# Patient Record
Sex: Male | Born: 1960 | Race: White | Hispanic: No | Marital: Married | Smoking: Former smoker
Health system: Southern US, Community
[De-identification: ages and names within clinical notes are randomized; demographics above are authoritative.]

## PROBLEM LIST (undated history)

## (undated) DIAGNOSIS — F329 Major depressive disorder, single episode, unspecified: Secondary | ICD-10-CM

## (undated) DIAGNOSIS — I499 Cardiac arrhythmia, unspecified: Secondary | ICD-10-CM

## (undated) DIAGNOSIS — K635 Polyp of colon: Secondary | ICD-10-CM

## (undated) DIAGNOSIS — G4700 Insomnia, unspecified: Secondary | ICD-10-CM

## (undated) DIAGNOSIS — F32A Depression, unspecified: Secondary | ICD-10-CM

## (undated) DIAGNOSIS — R001 Bradycardia, unspecified: Secondary | ICD-10-CM

## (undated) DIAGNOSIS — R413 Other amnesia: Secondary | ICD-10-CM

## (undated) HISTORY — DX: Other amnesia: R41.3

## (undated) HISTORY — PX: HERNIA REPAIR: SHX51

## (undated) HISTORY — DX: Polyp of colon: K63.5

## (undated) HISTORY — DX: Insomnia, unspecified: G47.00

## (undated) HISTORY — DX: Major depressive disorder, single episode, unspecified: F32.9

## (undated) HISTORY — DX: Bradycardia, unspecified: R00.1

## (undated) HISTORY — PX: OTHER SURGICAL HISTORY: SHX169

## (undated) HISTORY — DX: Depression, unspecified: F32.A

---

## 2003-10-26 ENCOUNTER — Ambulatory Visit (HOSPITAL_BASED_OUTPATIENT_CLINIC_OR_DEPARTMENT_OTHER): Admission: RE | Admit: 2003-10-26 | Discharge: 2003-10-26 | Payer: Self-pay | Admitting: Orthopedic Surgery

## 2003-10-26 ENCOUNTER — Ambulatory Visit (HOSPITAL_COMMUNITY): Admission: RE | Admit: 2003-10-26 | Discharge: 2003-10-26 | Payer: Self-pay | Admitting: Orthopedic Surgery

## 2004-02-29 ENCOUNTER — Ambulatory Visit (HOSPITAL_BASED_OUTPATIENT_CLINIC_OR_DEPARTMENT_OTHER): Admission: RE | Admit: 2004-02-29 | Discharge: 2004-02-29 | Payer: Self-pay | Admitting: Orthopedic Surgery

## 2005-04-13 ENCOUNTER — Other Ambulatory Visit: Admission: RE | Admit: 2005-04-13 | Discharge: 2005-04-13 | Payer: Self-pay | Admitting: Urology

## 2007-07-30 ENCOUNTER — Ambulatory Visit (HOSPITAL_COMMUNITY): Admission: RE | Admit: 2007-07-30 | Discharge: 2007-07-30 | Payer: Self-pay | Admitting: Family Medicine

## 2010-07-29 NOTE — Op Note (Signed)
NAMEZAYON, TRULSON               ACCOUNT NO.:  [id number]   MEDICAL RECORD NO.:  [id number]          PATIENT TYPE:  AMB   LOCATION:  DSC                          FACILITY:  [facility]   PHYSICIAN:  Feliberto Gottron. Turner Daniels, M.D.   DATE OF BIRTH:  Sep 05, 1960   DATE OF PROCEDURE:  02/29/2004  DATE OF DISCHARGE:                                 OPERATIVE REPORT   PREOPERATIVE DIAGNOSES:  Right knee medial meniscal tear.   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   OPERATION PERFORMED:  Right knee arthroscopic partial lateral meniscectomy  for a bucket handle complex tear and debridement of chondromalacia.   SURGEON:  Feliberto Gottron. Turner Daniels, M.D.   ASSISTANTLaural Benes. Jannet Mantis.   ANESTHESIA:  Local with intravenous sedation.   ESTIMATED BLOOD LOSS:  Minimal.   FLUIDS REPLACED:  900 mL crystalloids.   DRAINS:  None.   TOURNIQUET TIME:  None.   INDICATIONS FOR PROCEDURE:  The patient is a 50 year old man with ACL  reconstruction some 10 years ago on both sides, recently had an arthroscopic  debridement on the left.  He has a symptomatic medial meniscal tear on the  right and possible chondromalacia and he is taken for arthroscopic  evaluation and treatment of the same.   DESCRIPTION OF PROCEDURE:  The patient was identified by arm band and taken  to the operating room at [facility].  Appropriate  anesthetic monitors were attached.  Local anesthesia with intravenous  sedation was induced into the right lower extremity.  Lateral post applied  to the table.  Right lower extremity prepped and draped in the usual sterile  fashion from the ankle to the midthigh.  Using a #11 blade, standard  inferomedial and inferolateral parapatellar portals were made allowing  introduction of the arthroscope through the inferolateral portal and the  outflow  through the inferomedial portal.  Diagnostic arthroscopy revealed  grade 3 chondromalacia of the trochlea, medial femoral condyle and also the  distal aspect of the lateral femoral condyle requiring extensive debridement  with a 3.5 Gator sucker shaver and even slightly with a 4.2 [place]  sucker shaver.  Moving to the medial compartment we identified a medial  meniscal tear, bucket handle complex with a horizontal cleave tear as well.  This was debrided back to a stable margin removing most of the posterior  horn of the medial meniscus with a combination of the straight biters, the  4.2 [place] sucker shaver and a 3.5 Gator sucker shaver.  We also  debrided chondromalacia from the distal aspect of the medial femoral condyle  grade 2 to 3.  Moving into the notch, the ACL graft appeared to be somewhat  atrophic but was intact on the lateral side.  There was some lateral femoral  condyle chondromalacia that was debrided.  The lateral meniscus was intact  as was the  lateral tibial plateau.  The gutters were cleared.  The knee was irrigated  out with  normal saline solution.  The arthroscopic instruments were removed  and a dressing of Xeroform, 4 x 4 dressing sponges, Webril and Ace wrap was  applied.  The patient was unclamped, awakened and taken to the recovery room  without difficulty.      Emilio Aspen  D:  02/29/2004  T:  03/01/2004  Job:  [id number]

## 2010-07-29 NOTE — Op Note (Signed)
NAMEARNEZ, STONEKING                           ACCOUNT NO.:  [id number]   MEDICAL RECORD NO.:  [id number]                   PATIENT TYPE:  AMB   LOCATION:  DSC                                  FACILITY:  [facility]   PHYSICIAN:  Feliberto Gottron. Turner Daniels, M.D.                DATE OF BIRTH:  1960/10/29   DATE OF PROCEDURE:  10/26/2003  DATE OF DISCHARGE:                                 OPERATIVE REPORT   PREOPERATIVE DIAGNOSIS:  Left knee medial meniscal tear with Baker's cyst  and possible chondromalacia.   POSTOPERATIVE DIAGNOSES:  1. Left knee medial meniscal tear, transverse cleavage, complex, posterior     horn.  2. 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  3. Cartilaginous loose body, 1/2 x 1/14 inch x 3/16 inch.   PROCEDURE:  Left knee arthroscopic debridement and removal of loose body.   SURGEON:  Feliberto Gottron. Turner Daniels, M.D.   FIRST ASSISTANT:  Erskine Squibb B. Jannet Mantis.   ANESTHESIA:  Local with IV sedation.   ESTIMATED BLOOD LOSS:  Minimal.   FLUIDS REPLACED:  800 mL of crystalloid.   DRAINS PLACED:  None.   TOURNIQUET TIME:  None.   INDICATION FOR PROCEDURE:  The patient is a 50 year old man who has had a  previous endoscopic ACL reconstruction to his left knee and a couple of  arthroscopic debridements, now with chronic effusions, catchings and popping  in his left knee, also MRI-proven Baker's cyst and, by my reading, probable  medial meniscal tear.  He desires elective arthroscopic evaluation and  treatment of his left knee, having failed conservative measures and having  persistent pain, catching, and popping.  Risks and benefits of surgery are  understood by the patient.   DESCRIPTION OF PROCEDURE:  The patient identified by arm band and taken to  the operating room at [facility], appropriate anesthetic  monitors were attached, and local anesthetic with IV sedation induced into  the left knee.  A lateral post  applied to the table, the left lower  extremity prepped and draped in the usual sterile fashion from the ankle to  the mid-thigh, and we began the procedure by making standard inferomedial  and inferolateral peripatellar portals with a #11 blade, allowing  introduction of the arthroscope through the inferior lateral portal and the  outflow through the inferomedial portal.  Diagnostic arthroscopy revealed  minimal chondromalacia of the patella.  The trochlea had grade 3 to grade 4  chondromalacia over numerous areas and was debrided back to stable margins.  A large intra-articular cartilaginous loose body was identified,  photographed, and removed in the medial compartment, 1 x 2 x 0.5 cm in size.  The donor site was probably the trochlea or chondromalacia of the lateral  femoral condyle, which was also debrided.  Moving into the medial  compartment, we identified  a complex horizontal cleavage tear with a T  component in the posterior horn of the medial meniscus.  This was removed  piecemeal with the arthroscopic biters and a 3.5 gator sucker shaver, which  was also used to smooth out any articular cartilage tears during the  procedure.  At one point we were able to probe the tip, the 3.5 tip with the  motor off, into probably the lumen of the neck of the Baker's cyst and got  some fluid consistent with Baker's cyst fluid out through the neck of the  sac.  The ACL graft was probed and found to be intact on the lateral side,  the articular cartilage.  Again there was grade 3 to grade 4 chondromalacia  of the lateral femoral condyle.  The meniscus was in relatively good shape  with minimal fraying.  At this point the knee was irrigated out with normal  saline solution and the arthroscopic instruments removed.  A dressing of  Xeroform, 4 x 4 dressing sponges, Webril, and an Ace wrap applied.  The  patient was awakened and taken to the recovery room without difficulty.                                                Feliberto Gottron. Turner Daniels, M.D.    Ovid Curd  D:  10/26/2003  T:  10/26/2003  Job:  [id number]

## 2012-05-01 ENCOUNTER — Telehealth: Payer: Self-pay

## 2012-05-01 NOTE — Telephone Encounter (Signed)
Pt was referred by Dr. Phillips Odor for screening colonoscopy. Called, left message for pt to return call.

## 2012-05-07 NOTE — Telephone Encounter (Signed)
LMOM to call.

## 2012-05-16 NOTE — Telephone Encounter (Signed)
LMOM to call.

## 2012-05-20 NOTE — Telephone Encounter (Signed)
Letter to pt and PCP.  

## 2012-12-30 ENCOUNTER — Other Ambulatory Visit: Payer: Self-pay

## 2012-12-30 ENCOUNTER — Telehealth: Payer: Self-pay

## 2012-12-30 DIAGNOSIS — Z1211 Encounter for screening for malignant neoplasm of colon: Secondary | ICD-10-CM

## 2012-12-30 NOTE — Telephone Encounter (Signed)
Appropriate for procedure.

## 2012-12-30 NOTE — Telephone Encounter (Signed)
Gastroenterology Pre-Procedure Review  Request Date: 12/30/2012 Requesting Physician: Dr. Phillips Odor  PATIENT REVIEW QUESTIONS: The patient responded to the following health history questions as indicated:    1. Diabetes Melitis: no 2. Joint replacements in the past 12 months: no 3. Major health problems in the past 3 months: no 4. Has an artificial valve or MVP: no 5. Has a defibrillator: no 6. Has been advised in past to take antibiotics in advance of a procedure like teeth cleaning: no    MEDICATIONS & ALLERGIES:    Patient reports the following regarding taking any blood thinners:   Plavix? no Aspirin? Yes Coumadin? no  Patient confirms/reports the following medications:  Current Outpatient Prescriptions  Medication Sig Dispense Refill  . aspirin 81 MG tablet Take 81 mg by mouth daily.      [name] glucosamine-chondroitin 500-400 MG tablet Take 1 tablet by mouth 1 day or 1 dose. Once daily      . zolpidem (AMBIEN) 10 MG tablet Take 10 mg by mouth at bedtime as needed for sleep. Pt said he only takes 2-3 nights per week.       No current facility-administered medications for this visit.    Patient confirms/reports the following allergies:  No Known Allergies  No orders of the defined types were placed in this encounter.    AUTHORIZATION INFORMATION Primary Insurance:   ID #:   Group #:  Pre-Cert / Auth required:  Pre-Cert / Auth #:   Secondary Insurance:   ID #:   Group #:  Pre-Cert / Auth required: Pre-Cert / Auth #:   SCHEDULE INFORMATION: Procedure has been scheduled as follows:  Date: 01/17/2013              Time: 8:15 AM  Location: [facility] Short Stay  This Gastroenterology Pre-Precedure Review Form is being routed to the following provider(s): R. Roetta Sessions, MD

## 2012-12-31 MED ORDER — PEG-KCL-NACL-NASULF-NA ASC-C 100 G PO SOLR: 1.0000 | ORAL | Status: DC

## 2012-12-31 NOTE — Telephone Encounter (Signed)
Rx sent to pharmacy and instructions mailed to pt.  

## 2013-01-03 ENCOUNTER — Encounter (HOSPITAL_COMMUNITY): Payer: Self-pay | Admitting: Pharmacy Technician

## 2013-01-09 ENCOUNTER — Telehealth: Payer: Self-pay

## 2013-01-09 NOTE — Telephone Encounter (Signed)
I called [facility] at [phone] and spoke to [employer]. PA is not required for screening colonoscopy.

## 2013-01-15 ENCOUNTER — Telehealth: Payer: Self-pay

## 2013-01-15 NOTE — Telephone Encounter (Signed)
Pt called to go over the prep instructions. I had mistakenly put day of procedure to begin clear liquids. He remembered our conversation and confirmed that he is aware that he has no more solid foods after lunch today and will continue clear liquids on 01/16/2013.

## 2013-01-17 ENCOUNTER — Encounter (HOSPITAL_COMMUNITY)
Admission: RE | Disposition: A | Discharge status: Home / Self Care | Payer: Self-pay | Source: Ambulatory Visit | Attending: Internal Medicine

## 2013-01-17 ENCOUNTER — Ambulatory Visit (HOSPITAL_COMMUNITY)
Admission: RE | Admit: 2013-01-17 | Discharge: 2013-01-17 | Disposition: A | Discharge status: Home / Self Care | Payer: PRIVATE HEALTH INSURANCE | Source: Ambulatory Visit | Attending: Internal Medicine | Admitting: Internal Medicine

## 2013-01-17 ENCOUNTER — Encounter (HOSPITAL_COMMUNITY): Payer: Self-pay | Admitting: *Deleted

## 2013-01-17 DIAGNOSIS — K5730 Diverticulosis of large intestine without perforation or abscess without bleeding: Secondary | ICD-10-CM

## 2013-01-17 DIAGNOSIS — D126 Benign neoplasm of colon, unspecified: Secondary | ICD-10-CM

## 2013-01-17 DIAGNOSIS — Z1211 Encounter for screening for malignant neoplasm of colon: Secondary | ICD-10-CM | POA: Insufficient documentation

## 2013-01-17 HISTORY — PX: COLONOSCOPY: SHX5424

## 2013-01-17 SURGERY — COLONOSCOPY
Anesthesia: Moderate Sedation

## 2013-01-17 MED ORDER — STERILE WATER FOR IRRIGATION IR SOLN
Status: DC | PRN
  Administered 2013-01-17: 08:00:00

## 2013-01-17 MED ORDER — MIDAZOLAM HCL 5 MG/5ML IJ SOLN
INTRAMUSCULAR | Status: DC | PRN
  Administered 2013-01-17: 2 mg via INTRAVENOUS
  Administered 2013-01-17 (×3): 1 mg via INTRAVENOUS

## 2013-01-17 MED ORDER — SODIUM CHLORIDE 0.9 % IV SOLN
INTRAVENOUS | Status: DC
  Administered 2013-01-17: 1000 mL via INTRAVENOUS

## 2013-01-17 MED ORDER — MEPERIDINE HCL 100 MG/ML IJ SOLN
INTRAMUSCULAR | Status: DC | PRN
  Administered 2013-01-17: 50 mg via INTRAVENOUS
  Administered 2013-01-17: 25 mg via INTRAVENOUS

## 2013-01-17 MED ORDER — MIDAZOLAM HCL 5 MG/5ML IJ SOLN
INTRAMUSCULAR | Status: AC
  Filled 2013-01-17: qty 10

## 2013-01-17 MED ORDER — MEPERIDINE HCL 100 MG/ML IJ SOLN
INTRAMUSCULAR | Status: AC
  Filled 2013-01-17: qty 2

## 2013-01-17 MED ORDER — ONDANSETRON HCL 4 MG/2ML IJ SOLN
INTRAMUSCULAR | Status: AC
  Filled 2013-01-17: qty 2

## 2013-01-17 MED ORDER — ONDANSETRON HCL 4 MG/2ML IJ SOLN
INTRAMUSCULAR | Status: DC | PRN
  Administered 2013-01-17: 4 mg via INTRAVENOUS

## 2013-01-17 NOTE — Op Note (Signed)
[facility] [address] [place], [address]   COLONOSCOPY PROCEDURE REPORT  PATIENT: Alex, Weaver  MR#:         [id number] BIRTHDATE: 10/16/1960 , 52  yrs. old GENDER: Male ENDOSCOPIST: R.  Roetta Sessions, MD FACP FACG REFERRED BY:  Assunta Found, M.D. PROCEDURE DATE:  01/17/2013 PROCEDURE:     Colonoscopy with snare polypectomy  INDICATIONS: First-ever average risk colorectal cancer screening examination  INFORMED CONSENT:  The risks, benefits, alternatives and imponderables including but not limited to bleeding, perforation as well as the possibility of a missed lesion have been reviewed.  The potential for biopsy, lesion removal, etc. have also been discussed.  Questions have been answered.  All parties agreeable. Please see the history and physical in the medical record for more information.  MEDICATIONS: Versed 5 mg IV and Demerol 75 mg IV in divided doses. Zofran 4 mg IV  DESCRIPTION OF PROCEDURE:  After a digital rectal exam was performed, the EC-3890Li ([id number])  colonoscope was advanced from the anus through the rectum and colon to the area of the cecum, ileocecal valve and appendiceal orifice.  The cecum was deeply intubated.  These structures were well-seen and photographed for the record.  From the level of the cecum and ileocecal valve, the scope was slowly and cautiously withdrawn.  The mucosal surfaces were carefully surveyed utilizing scope tip deflection to facilitate fold flattening as needed.  The scope was pulled down into the rectum where a thorough examination including retroflexion was performed.    FINDINGS:  Adequate preparation. Normal rectum. Few scattered sigmoid diverticula; (1) 5 mm sessile polyp in the base of the cecum..  Otherwise, the remainder of the colonic mucosa appeared normal.  THERAPEUTIC / DIAGNOSTIC MANEUVERS PERFORMED:  The above-mentioned polyps wascold snare removed  COMPLICATIONS: none  CECAL  WITHDRAWAL TIME:  11 minutes  IMPRESSION:  Colonic diverticulosis. Colonic polyp-removed as described above  RECOMMENDATIONS: Followup on pathology   _______________________________ eSigned:  R. Roetta Sessions, MD FACP [facility] 01/17/2013 9:01 AM   CC:    PATIENT NAME:  Alex, Weaver MR#: [id number]

## 2013-01-17 NOTE — H&P (Signed)
  Primary Care Physician:  Colette Ribas, MD Primary Gastroenterologist:  Dr. Jena Gauss  Pre-Procedure History & Physical: HPI:  Alex Weaver is a 52 y.o. male is here for a screening colonoscopy. No bowel symptoms. No prior colonoscopy. No family history of colon cancer or polyps.  History reviewed. No pertinent past medical history.  Past Surgical History  Procedure Laterality Date  . Bilateral knee surgery Bilateral     bilateral acl repair  . Hernia repair Left     inguinal    Prior to Admission medications   Medication Sig Start Date End Date Taking? Authorizing Provider  aspirin 81 MG tablet Take 81 mg by mouth daily.   Yes Historical Provider, MD  glucosamine-chondroitin 500-400 MG tablet Take 1 tablet by mouth 1 day or 1 dose. Once daily   Yes Historical Provider, MD  peg 3350 powder (MOVIPREP) 100 G SOLR Take 1 kit (200 g total) by mouth as directed. 12/31/12  Yes Corbin Ade, MD  zolpidem (AMBIEN) 10 MG tablet Take 10 mg by mouth at bedtime as needed for sleep. Pt said he only takes 2-3 nights per week.   Yes Historical Provider, MD    Allergies as of 12/30/2012  . (No Known Allergies)    History reviewed. No pertinent family history.  History   Social History  . Marital Status: Married    Spouse Name: N/A    Number of Children: N/A  . Years of Education: N/A   Occupational History  . Not on file.   Social History Main Topics  . Smoking status: Former Smoker    Types: Cigarettes  . Smokeless tobacco: Not on file  . Alcohol Use: Not on file  . Drug Use: Not on file  . Sexual Activity: Not on file   Other Topics Concern  . Not on file   Social History Narrative  . No narrative on file    Review of Systems: See HPI, otherwise negative ROS  Physical Exam: BP 121/72  Pulse 67  Temp(Src) 97.6 F (36.4 C) (Oral)  Resp 15  Ht 5\' 10"  (1.778 m)  Wt 175 lb (79.379 kg)  BMI 25.11 kg/m2  SpO2 99% General:   Alert,  Well-developed,  well-nourished, pleasant and cooperative in NAD Head:  Normocephalic and atraumatic. Eyes:  Sclera clear, no icterus.   Conjunctiva pink. Ears:  Normal auditory acuity. Nose:  No deformity, discharge,  or lesions. Mouth:  No deformity or lesions, dentition normal. Neck:  Supple; no masses or thyromegaly. Lungs:  Clear throughout to auscultation.   No wheezes, crackles, or rhonchi. No acute distress. Heart:  Regular rate and rhythm; no murmurs, clicks, rubs,  or gallops. Abdomen:  Soft, nontender and nondistended. No masses, hepatosplenomegaly or hernias noted. Normal bowel sounds, without guarding, and without rebound.   Msk:  Symmetrical without gross deformities. Normal posture. Pulses:  Normal pulses noted. Extremities:  Without clubbing or edema. Neurologic:  Alert and  oriented x4;  grossly normal neurologically. Impression/Plan: Alex Weaver is now here to undergo a screening colonoscopy.  First-ever average risk screening examination Risks, benefits, limitations, imponderables and alternatives regarding colonoscopy have been reviewed with the patient. Questions have been answered. All parties agreeable.

## 2013-01-20 ENCOUNTER — Encounter: Payer: Self-pay | Admitting: Internal Medicine

## 2013-01-22 ENCOUNTER — Encounter (HOSPITAL_COMMUNITY): Payer: Self-pay | Admitting: Internal Medicine

## 2017-01-17 DIAGNOSIS — F329 Major depressive disorder, single episode, unspecified: Secondary | ICD-10-CM | POA: Diagnosis not present

## 2017-01-17 DIAGNOSIS — Z23 Encounter for immunization: Secondary | ICD-10-CM | POA: Diagnosis not present

## 2017-01-17 DIAGNOSIS — Z1389 Encounter for screening for other disorder: Secondary | ICD-10-CM | POA: Diagnosis not present

## 2017-01-17 DIAGNOSIS — Z6827 Body mass index (BMI) 27.0-27.9, adult: Secondary | ICD-10-CM | POA: Diagnosis not present

## 2017-01-17 DIAGNOSIS — G4700 Insomnia, unspecified: Secondary | ICD-10-CM | POA: Diagnosis not present

## 2017-01-17 DIAGNOSIS — E663 Overweight: Secondary | ICD-10-CM | POA: Diagnosis not present

## 2017-07-27 DIAGNOSIS — R001 Bradycardia, unspecified: Secondary | ICD-10-CM | POA: Diagnosis not present

## 2017-07-27 DIAGNOSIS — Z0001 Encounter for general adult medical examination with abnormal findings: Secondary | ICD-10-CM | POA: Diagnosis not present

## 2017-07-27 DIAGNOSIS — Z6828 Body mass index (BMI) 28.0-28.9, adult: Secondary | ICD-10-CM | POA: Diagnosis not present

## 2017-07-27 DIAGNOSIS — F329 Major depressive disorder, single episode, unspecified: Secondary | ICD-10-CM | POA: Diagnosis not present

## 2017-07-27 DIAGNOSIS — R413 Other amnesia: Secondary | ICD-10-CM | POA: Diagnosis not present

## 2017-07-27 DIAGNOSIS — E663 Overweight: Secondary | ICD-10-CM | POA: Diagnosis not present

## 2017-07-27 DIAGNOSIS — G4700 Insomnia, unspecified: Secondary | ICD-10-CM | POA: Diagnosis not present

## 2017-07-27 DIAGNOSIS — Z1389 Encounter for screening for other disorder: Secondary | ICD-10-CM | POA: Diagnosis not present

## 2017-07-27 DIAGNOSIS — K635 Polyp of colon: Secondary | ICD-10-CM | POA: Diagnosis not present

## 2017-07-30 DIAGNOSIS — Z1389 Encounter for screening for other disorder: Secondary | ICD-10-CM | POA: Diagnosis not present

## 2017-08-14 ENCOUNTER — Ambulatory Visit (INDEPENDENT_AMBULATORY_CARE_PROVIDER_SITE_OTHER): Payer: BLUE CROSS/BLUE SHIELD | Admitting: Neurology

## 2017-08-14 ENCOUNTER — Encounter: Payer: Self-pay | Admitting: *Deleted

## 2017-08-14 ENCOUNTER — Encounter: Payer: Self-pay | Admitting: Neurology

## 2017-08-14 VITALS — BP 112/73 | HR 64 | Ht 70.0 in | Wt 195.0 lb

## 2017-08-14 DIAGNOSIS — R413 Other amnesia: Secondary | ICD-10-CM | POA: Insufficient documentation

## 2017-08-14 NOTE — Progress Notes (Signed)
PATIENT: Alex Weaver DOB: 30-Nov-1960  Chief Complaint  Patient presents with  . Memory Loss    MMSE 30/30 - 23 animals.  He is here with his wife, [name], to have his short-term memory loss further evaluated.  He has been having increasing problems over the last six months.  He is concerned due to his mother having dementia.  [name] PCP    Sharilyn Sites, MD     HISTORICAL  Alex Weaver is a 57 years old male, seen in refer by his primary care doctor Sharilyn Sites for evaluation of memory loss, initial evaluation was on June 06/2017.  He is accompanied by his wife [name] at today's clinical visit.  I reviewed and summarized the referring note, he has past medical history of mild dementia he is taking Lexapro 10 mg daily, he readily from college, is a Librarian, academic for quality laboratory.  His mother suffered dementia, she became symptomatic in her late 46s, at age 22, she was at assisted living.  Mr. [name] went through stressful few years, he has a lot of stress at his job, also have to place his mother into facility, his wife also suffered cancer, went through treatment.  He developed mild depression, loss of 30 pounds over short period of time, overall he has mild improvement.  Over the past couple years, he was noted to have mild memory loss, tends to misplace things, hard to keep up with new names, sometimes forgot to lock the door,  He is otherwise functioning well, denied difficulty dealing with his job or daily activity.  Laboratory evaluations showed alcohol level less than 5, normal CMP, creatinine 1.09, hemoglobin of 15.3, normal TSH 3.09, LDL was mildly elevated 128, cholesterol was 208, testosterone 439,  REVIEW OF SYSTEMS: Full 14 system review of systems performed and notable only for increased thirst, joint pain, memory loss, insomnia, not enough sleep  ALLERGIES: No Known Allergies  HOME MEDICATIONS: Current Outpatient Medications  Medication Sig Dispense Refill    . escitalopram (LEXAPRO) 10 MG tablet Take 10 mg by mouth daily.    [name] glucosamine-chondroitin 500-400 MG tablet Take 1 tablet by mouth 1 day or 1 dose. Once daily    . TEMAZEPAM PO Take 1 tablet by mouth at bedtime.     No current facility-administered medications for this visit.     PAST MEDICAL HISTORY: Past Medical History:  Diagnosis Date  . Bradycardia   . Depression   . Insomnia   . Memory loss   . Polyp of colon     PAST SURGICAL HISTORY: Past Surgical History:  Procedure Laterality Date  . bilateral knee surgery Bilateral    bilateral acl repair  . COLONOSCOPY N/A 01/17/2013   Procedure: COLONOSCOPY;  Surgeon: Daneil Dolin, MD;  Location: [facility];  Service: Endoscopy;  Laterality: N/A;  8:15 AM  . HERNIA REPAIR Left    inguinal    FAMILY HISTORY: Family History  Problem Relation Age of Onset  . Dementia Mother   . Lung cancer Father   . Diabetes Father     SOCIAL HISTORY:  Social History   Socioeconomic History  . Marital status: Married    Spouse name: Not on file  . Number of children: 1  . Years of education: 47  . Highest education level: Bachelor's degree (e.g., BA, AB, BS)  Occupational History  . Occupation: Facilities manager  Social Needs  . Financial resource strain: Not on file  .  Food insecurity:    Worry: Not on file    Inability: Not on file  . Transportation needs:    Medical: Not on file    Non-medical: Not on file  Tobacco Use  . Smoking status: Former Smoker    Types: Cigarettes  . Smokeless tobacco: Never Used  Substance and Sexual Activity  . Alcohol use: Yes    Comment: occasional   . Drug use: Never  . Sexual activity: Not on file  Lifestyle  . Physical activity:    Days per week: Not on file    Minutes per session: Not on file  . Stress: Not on file  Relationships  . Social connections:    Talks on phone: Not on file    Gets together: Not on file    Attends religious service: Not on file    Active  member of club or organization: Not on file    Attends meetings of clubs or organizations: Not on file    Relationship status: Not on file  . Intimate partner violence:    Fear of current or ex partner: Not on file    Emotionally abused: Not on file    Physically abused: Not on file    Forced sexual activity: Not on file  Other Topics Concern  . Not on file  Social History Narrative   Caffeine use: 2 cups per day.   Right-handed.   Lives at home with his wife.     PHYSICAL EXAM   Vitals:   08/14/17 0735  BP: 112/73  Pulse: 64  Weight: 195 lb (88.5 kg)  Height: 5\' 10"  (1.778 m)    Not recorded      Body mass index is 27.98 kg/m.  PHYSICAL EXAMNIATION:  Gen: NAD, conversant, well nourised, obese, well groomed                     Cardiovascular: Regular rate rhythm, no peripheral edema, warm, nontender. Eyes: Conjunctivae clear without exudates or hemorrhage Neck: Supple, no carotid bruits. Pulmonary: Clear to auscultation bilaterally   NEUROLOGICAL EXAM:  MENTAL STATUS: MMSE - Mini Mental State Exam 08/14/2017  Orientation to time 5  Orientation to Place 5  Registration 3  Attention/ Calculation 5  Recall 3  Language- name 2 objects 2  Language- repeat 1  Language- follow 3 step command 3  Language- read & follow direction 1  Write a sentence 1  Copy design 1  Total score 30  animal naming 23   CRANIAL NERVES: CN II: Visual fields are full to confrontation. Fundoscopic exam is normal with sharp discs and no vascular changes. Pupils are round equal and briskly reactive to light. CN III, IV, VI: extraocular movement are normal. No ptosis. CN V: Facial sensation is intact to pinprick in all 3 divisions bilaterally. Corneal responses are intact.  CN VII: Face is symmetric with normal eye closure and smile. CN VIII: Hearing is normal to rubbing fingers CN IX, X: Palate elevates symmetrically. Phonation is normal. CN XI: Head turning and shoulder shrug are  intact CN XII: Tongue is midline with normal movements and no atrophy.  MOTOR: There is no pronator drift of out-stretched arms. Muscle bulk and tone are normal. Muscle strength is normal.  REFLEXES: Reflexes are 2+ and symmetric at the biceps, triceps, knees, and ankles. Plantar responses are flexor.  SENSORY: Intact to light touch, pinprick, positional sensation and vibratory sensation are intact in fingers and toes.  COORDINATION: Rapid alternating  movements and fine finger movements are intact. There is no dysmetria on finger-to-nose and heel-knee-shin.    GAIT/STANCE: Posture is normal. Gait is steady with normal steps, base, arm swing, and turning. Heel and toe walking are normal. Tandem gait is normal.  Romberg is absent.   DIAGNOSTIC DATA (LABS, IMAGING, TESTING) - I reviewed patient records, labs, notes, testing and imaging myself where available.   ASSESSMENT AND PLAN  DARREK LEASURE is a 57 y.o. male   Memory loss  In the setting of extreme stress, mild depression,  His mood disorder likely play a major role in these complaints,  Does have family history of dementia  Proceed with MRI of brain without contrast   Check B12 level   Marcial Pacas, M.D. Ph.D.  [facility] [address], [place], [place] [address] Ph: [phone] Fax: [phone]  CC: Sharilyn Sites, MD

## 2017-08-15 ENCOUNTER — Telehealth: Payer: Self-pay | Admitting: *Deleted

## 2017-08-15 ENCOUNTER — Telehealth: Payer: Self-pay | Admitting: Neurology

## 2017-08-15 LAB — B12 AND FOLATE PANEL
Folate: >20 ng/mL (ref >3.0)
Vitamin B-12: 477 pg/mL (ref 232–1245)

## 2017-08-15 NOTE — Telephone Encounter (Signed)
Spoke to patient's wife on [place].  She is aware of results.

## 2017-08-15 NOTE — Telephone Encounter (Signed)
-----   Message from Marcial Pacas, MD sent at 08/15/2017 11:34 AM EDT ----- Please call patient for normal laboratory result

## 2017-08-15 NOTE — Telephone Encounter (Signed)
lvm for pt to call back about scheduling mri  [employer] BTYO:[id number] (exp. 08/14/17 to 09/12/17)

## 2017-08-15 NOTE — Telephone Encounter (Signed)
The patient returned my call. He is scheduled for 08/22/17 at [facility].

## 2017-08-22 ENCOUNTER — Ambulatory Visit: Payer: BLUE CROSS/BLUE SHIELD

## 2017-08-22 DIAGNOSIS — R413 Other amnesia: Secondary | ICD-10-CM

## 2017-08-24 ENCOUNTER — Telehealth: Payer: Self-pay | Admitting: *Deleted

## 2017-08-24 NOTE — Telephone Encounter (Signed)
-----   Message from Marcial Pacas, MD sent at 08/24/2017 10:30 AM EDT ----- Please call pt for normal MRI brain.

## 2017-08-24 NOTE — Telephone Encounter (Signed)
Spoke to patient - he is aware of his MRI results.

## 2017-11-26 DIAGNOSIS — M25511 Pain in right shoulder: Secondary | ICD-10-CM | POA: Diagnosis not present

## 2017-11-26 DIAGNOSIS — M6281 Muscle weakness (generalized): Secondary | ICD-10-CM | POA: Diagnosis not present

## 2017-11-28 DIAGNOSIS — M25511 Pain in right shoulder: Secondary | ICD-10-CM | POA: Diagnosis not present

## 2017-11-28 DIAGNOSIS — M6281 Muscle weakness (generalized): Secondary | ICD-10-CM | POA: Diagnosis not present

## 2017-12-03 DIAGNOSIS — M6281 Muscle weakness (generalized): Secondary | ICD-10-CM | POA: Diagnosis not present

## 2017-12-03 DIAGNOSIS — M25511 Pain in right shoulder: Secondary | ICD-10-CM | POA: Diagnosis not present

## 2017-12-05 DIAGNOSIS — M25511 Pain in right shoulder: Secondary | ICD-10-CM | POA: Diagnosis not present

## 2017-12-05 DIAGNOSIS — M6281 Muscle weakness (generalized): Secondary | ICD-10-CM | POA: Diagnosis not present

## 2017-12-11 DIAGNOSIS — M25511 Pain in right shoulder: Secondary | ICD-10-CM | POA: Diagnosis not present

## 2017-12-11 DIAGNOSIS — M6281 Muscle weakness (generalized): Secondary | ICD-10-CM | POA: Diagnosis not present

## 2017-12-13 DIAGNOSIS — M25511 Pain in right shoulder: Secondary | ICD-10-CM | POA: Diagnosis not present

## 2017-12-13 DIAGNOSIS — M6281 Muscle weakness (generalized): Secondary | ICD-10-CM | POA: Diagnosis not present

## 2017-12-18 DIAGNOSIS — M6281 Muscle weakness (generalized): Secondary | ICD-10-CM | POA: Diagnosis not present

## 2017-12-18 DIAGNOSIS — M25511 Pain in right shoulder: Secondary | ICD-10-CM | POA: Diagnosis not present

## 2017-12-24 ENCOUNTER — Encounter: Payer: Self-pay | Admitting: Internal Medicine

## 2017-12-27 DIAGNOSIS — M25511 Pain in right shoulder: Secondary | ICD-10-CM | POA: Diagnosis not present

## 2018-01-17 ENCOUNTER — Ambulatory Visit (INDEPENDENT_AMBULATORY_CARE_PROVIDER_SITE_OTHER): Payer: BLUE CROSS/BLUE SHIELD

## 2018-01-17 DIAGNOSIS — Z86010 Personal history of colonic polyps: Secondary | ICD-10-CM

## 2018-01-17 NOTE — Progress Notes (Addendum)
Gastroenterology Pre-Procedure Review  Request Date:01/17/18 Requesting Physician: 5 year recall- RMR- last tcs 01/17/13- tubular adenoma  PATIENT REVIEW QUESTIONS: The patient responded to the following health history questions as indicated:    1. Diabetes Melitis: no 2. Joint replacements in the past 12 months: no 3. Major health problems in the past 3 months: no 4. Has an artificial valve or MVP: no 5. Has a defibrillator: no 6. Has been advised in past to take antibiotics in advance of a procedure like teeth cleaning: no 7. Family history of colon cancer: no  8. Alcohol Use: yes ( 2-3 drinks on weekends) 9. History of sleep apnea: no  10. History of coronary artery or other vascular stents placed within the last 12 months: no 11. History of any prior anesthesia complications: no    MEDICATIONS & ALLERGIES:    Patient reports the following regarding taking any blood thinners:   Plavix? no Aspirin? no Coumadin? no Brilinta? no Xarelto? no Eliquis? no Pradaxa? no Savaysa? no Effient? no  Patient confirms/reports the following medications:  Current Outpatient Medications  Medication Sig Dispense Refill  . escitalopram (LEXAPRO) 10 MG tablet Take 20 mg by mouth every other day.     [name] glucosamine-chondroitin 500-400 MG tablet Take 1 tablet by mouth 1 day or 1 dose. Once daily    . Multiple Vitamin (MULTIVITAMIN) tablet Take 1 tablet by mouth daily.    [name] TEMAZEPAM PO Take 1 tablet by mouth at bedtime.     No current facility-administered medications for this visit.     Patient confirms/reports the following allergies:  No Known Allergies  No orders of the defined types were placed in this encounter.   AUTHORIZATION INFORMATION Primary Insurance: [place] ,  [place] #: [id number] Pre-Cert / Josem Kaufmann required: no  Pre-Cert / Auth #: file with local BCBS   SCHEDULE INFORMATION: Procedure has been scheduled as follows:  Date: , Time:  Location:   This Gastroenterology  Pre-Precedure Review Form is being routed to the following provider(s): Neil Crouch, PA

## 2018-01-17 NOTE — Progress Notes (Signed)
Pt can only have his procedure done on a Tues or Fri due to his wifes work schedule. We do not have anything available on those days at this time. The schedule is only out until January right now. He wants me to call him to schedule if anything is available in February.

## 2018-01-18 NOTE — Progress Notes (Signed)
Noted. Plan for conscious sedation.

## 2018-01-22 DIAGNOSIS — Z6827 Body mass index (BMI) 27.0-27.9, adult: Secondary | ICD-10-CM | POA: Diagnosis not present

## 2018-01-22 DIAGNOSIS — E663 Overweight: Secondary | ICD-10-CM | POA: Diagnosis not present

## 2018-01-22 DIAGNOSIS — Z1389 Encounter for screening for other disorder: Secondary | ICD-10-CM | POA: Diagnosis not present

## 2018-01-22 DIAGNOSIS — I499 Cardiac arrhythmia, unspecified: Secondary | ICD-10-CM | POA: Diagnosis not present

## 2018-01-22 DIAGNOSIS — R008 Other abnormalities of heart beat: Secondary | ICD-10-CM | POA: Diagnosis not present

## 2018-01-30 MED ORDER — NA SULFATE-K SULFATE-MG SULF 17.5-3.13-1.6 GM/177ML PO SOLN: 1.0000 | ORAL | 0 refills | Status: DC

## 2018-01-30 NOTE — Progress Notes (Signed)
Called pt- scheduled him for 04/19/18 at 8:30 with RMR- rx sent to the pharmacy and instructions mailed to the pt.

## 2018-01-30 NOTE — Addendum Note (Signed)
Addended by: Claudina Lick on: 01/30/2018 01:55 PM   Modules accepted: Orders, SmartSet

## 2018-01-30 NOTE — Addendum Note (Signed)
Addended by: Claudina Lick on: 01/30/2018 01:48 PM   Modules accepted: Orders

## 2018-01-30 NOTE — Patient Instructions (Signed)
Alex Weaver  [birth year] MRN: [id number]     Procedure Date: 04/19/18 Time to register: 7:30am Place to register: [facility] Procedure Time: 8:30am Scheduled provider: R. Garfield Cornea, MD    PREPARATION FOR COLONOSCOPY WITH SUPREP BOWEL PREP KIT  Note: Suprep Bowel Prep Kit is a split-dose (2day) regimen. Consumption of BOTH 6-ounce bottles is required for a complete prep.  Please notify us immediately if you are diabetic, take iron supplements, or if you are on Coumadin or any other blood thinners.                                                                                                                                                2 DAYS BEFORE PROCEDURE:  DATE: 04/17/18   DAY: Wednesday Begin clear liquid diet AFTER your lunch meal. NO SOLID FOODS after this point.  1 DAY BEFORE PROCEDURE:  DATE: 04/18/18   DAY: Thursday Continue clear liquids the entire day - NO SOLID FOOD.   At 6:00pm: Complete steps 1 through 4 below, using ONE (1) 6-ounce bottle, before going to bed. Step 1:  Pour ONE (1) 6-ounce bottle of SUPREP liquid into the mixing container.  Step 2:  Add cool drinking water to the 16 ounce line on the container and mix.  Note: Dilute the solution concentrate as directed prior to use. Step 3:  DRINK ALL the liquid in the container. Step 4:  You MUST drink an additional two (2) or more 16 ounce containers of water over the next one (1) hour.   Continue clear liquids.  DAY OF PROCEDURE:   DATE: 04/19/18  DAY: Friday If you take medications for your heart, blood pressure, or breathing, you may take these medications.   5 hours before your procedure at 3:30am Step 1:  Pour ONE (1) 6-ounce bottle of SUPREP liquid into the mixing container.  Step 2:  Add cool drinking water to the 16 ounce line on the container and mix.  Note: Dilute the solution concentrate as directed prior to use. Step 3:  DRINK ALL the liquid in the container. Step 4:  You MUST drink an  additional two (2) or more 16 ounce containers of water over the next one (1) hour. You MUST complete the final glass of water at least 3 hours before your colonoscopy.   Nothing by mouth past 5:30am  You may take your morning medications with sip of water unless we have instructed otherwise.    Please see below for Dietary Information.  CLEAR LIQUIDS INCLUDE:  Water Jello (NOT red in color)   Ice Popsicles (NOT red in color)   Tea (sugar ok, no milk/cream) Powdered fruit flavored drinks  Coffee (sugar ok, no milk/cream) Gatorade/ Lemonade/ Kool-Aid  (NOT red in color)   Juice: apple, white grape, white cranberry Soft drinks  Clear bullion, consomme,  broth (fat free beef/chicken/vegetable)  Carbonated beverages (any kind)  Strained chicken noodle soup Hard Candy   Remember: Clear liquids are liquids that will allow you to see your fingers on the other side of a clear glass. Be sure liquids are NOT red in color, and not cloudy, but CLEAR.  DO NOT EAT OR DRINK ANY OF THE FOLLOWING:  Dairy products of any kind   Cranberry juice Tomato juice / V8 juice   Grapefruit juice Orange juice     Red grape juice  Do not eat any solid foods, including such foods as: cereal, oatmeal, yogurt, fruits, vegetables, creamed soups, eggs, bread, crackers, pureed foods in a blender, etc.   HELPFUL HINTS FOR DRINKING PREP SOLUTION:   Make sure prep is extremely cold. Mix and refrigerate the the morning of the prep. You may also put in the freezer.   You may try mixing some Crystal Light or Country Time Lemonade if you prefer. Mix in small amounts; add more if necessary.  Try drinking through a straw  Rinse mouth with water or a mouthwash between glasses, to remove after-taste.  Try sipping on a cold beverage /ice/ popsicles between glasses of prep.  Place a piece of sugar-free hard candy in mouth between glasses.  If you become nauseated, try consuming smaller amounts, or stretch out the time  between glasses. Stop for 30-60 minutes, then slowly start back drinking.     OTHER INSTRUCTIONS  You will need a responsible adult at least 55 years of age to accompany you and drive you home. This person must remain in the waiting room during your procedure. The hospital will cancel your procedure if you do not have a responsible adult with you.   1. Wear loose fitting clothing that is easily removed. 2. Leave jewelry and other valuables at home.  3. Remove all body piercing jewelry and leave at home. 4. Total time from sign-in until discharge is approximately 2-3 hours. 5. You should go home directly after your procedure and rest. You can resume normal activities the day after your procedure. 6. The day of your procedure you should not:  Drive  Make legal decisions  Operate machinery  Drink alcohol  Return to work   You may call the office (Dept: [phone]) before 5:00pm, or page the doctor on call [phone]) after 5:00pm, for further instructions, if necessary.   Insurance Information YOU WILL NEED TO CHECK WITH YOUR INSURANCE COMPANY FOR THE BENEFITS OF COVERAGE YOU HAVE FOR THIS PROCEDURE.  UNFORTUNATELY, NOT ALL INSURANCE COMPANIES HAVE BENEFITS TO COVER ALL OR PART OF THESE TYPES OF PROCEDURES.  IT IS YOUR RESPONSIBILITY TO CHECK YOUR BENEFITS, HOWEVER, WE WILL BE GLAD TO ASSIST YOU WITH ANY CODES YOUR INSURANCE COMPANY MAY NEED.    PLEASE NOTE THAT MOST INSURANCE COMPANIES WILL NOT COVER A SCREENING COLONOSCOPY FOR PEOPLE UNDER THE AGE OF 55  IF YOU HAVE BCBS INSURANCE, YOU MAY HAVE BENEFITS FOR A SCREENING COLONOSCOPY BUT IF POLYPS ARE FOUND THE DIAGNOSIS WILL CHANGE AND THEN YOU MAY HAVE A DEDUCTIBLE THAT WILL NEED TO BE MET. SO PLEASE MAKE SURE YOU CHECK YOUR BENEFITS FOR A SCREENING COLONOSCOPY AS WELL AS A DIAGNOSTIC COLONOSCOPY.

## 2018-03-22 ENCOUNTER — Ambulatory Visit (INDEPENDENT_AMBULATORY_CARE_PROVIDER_SITE_OTHER): Payer: BLUE CROSS/BLUE SHIELD | Admitting: Cardiology

## 2018-03-22 ENCOUNTER — Encounter: Payer: Self-pay | Admitting: Cardiology

## 2018-03-22 ENCOUNTER — Ambulatory Visit (INDEPENDENT_AMBULATORY_CARE_PROVIDER_SITE_OTHER): Payer: BLUE CROSS/BLUE SHIELD

## 2018-03-22 VITALS — BP 98/60 | HR 69 | Ht 70.0 in | Wt 194.0 lb

## 2018-03-22 DIAGNOSIS — I499 Cardiac arrhythmia, unspecified: Secondary | ICD-10-CM

## 2018-03-22 DIAGNOSIS — R002 Palpitations: Secondary | ICD-10-CM

## 2018-03-22 NOTE — Progress Notes (Signed)
Clinical Summary Alex Weaver is a 58 y.o.male seen as new consult, referred by Dr Alex Weaver for irregular heat beat  1.Irregular heart  - minimal feelings of palpitations. Told in the past he has had some irregular beats, at previous doctor visits and also 2 different times while going to donate blood where he was told his pulse was "irregular". Notes some high heart rates with lower levels of exercise than usual for him. Told of irregular pulse.   - coffee x 2-3 cups, occasional tea, no sodas, no energy drinks, drinks on weekends.   01/2018 labs with pcp: K 4.3,  - pcp EKG showed NSR   Past Medical History:  Diagnosis Date  . Bradycardia   . Depression   . Insomnia   . Memory loss   . Polyp of colon      No Known Allergies   Current Outpatient Medications  Medication Sig Dispense Refill  . escitalopram (LEXAPRO) 10 MG tablet Take 20 mg by mouth every other day.     [name] glucosamine-chondroitin 500-400 MG tablet Take 1 tablet by mouth 1 day or 1 dose. Once daily    . Multiple Vitamin (MULTIVITAMIN) tablet Take 1 tablet by mouth daily.    . Na Sulfate-K Sulfate-Mg Sulf (SUPREP BOWEL PREP KIT) 17.5-3.13-1.6 GM/177ML SOLN Take 1 kit by mouth as directed. 1 Bottle 0  . TEMAZEPAM PO Take 1 tablet by mouth at bedtime.     No current facility-administered medications for this visit.      Past Surgical History:  Procedure Laterality Date  . bilateral knee surgery Bilateral    bilateral acl repair  . COLONOSCOPY N/A 01/17/2013   Procedure: COLONOSCOPY;  Surgeon: Alex Dolin, MD;  Location: [facility];  Service: Endoscopy;  Laterality: N/A;  8:15 AM  . HERNIA REPAIR Left    inguinal     No Known Allergies    Family History  Problem Relation Age of Onset  . Dementia Mother   . Lung cancer Father   . Diabetes Father      Social History Alex Weaver reports that he has quit smoking. His smoking use included cigarettes. He has never used smokeless tobacco. Mr.  Weaver reports current alcohol use.   Review of Systems CONSTITUTIONAL: No weight loss, fever, chills, weakness or fatigue.  HEENT: Eyes: No visual loss, blurred vision, double vision or yellow sclerae.No hearing loss, sneezing, congestion, runny nose or sore throat.  SKIN: No rash or itching.  CARDIOVASCULAR: per hpi RESPIRATORY: No shortness of breath, cough or sputum.  GASTROINTESTINAL: No anorexia, nausea, vomiting or diarrhea. No abdominal pain or blood.  GENITOURINARY: No burning on urination, no polyuria NEUROLOGICAL: No headache, dizziness, syncope, paralysis, ataxia, numbness or tingling in the extremities. No change in bowel or bladder control.  MUSCULOSKELETAL: No muscle, back pain, joint pain or stiffness.  LYMPHATICS: No enlarged nodes. No history of splenectomy.  PSYCHIATRIC: No history of depression or anxiety.  ENDOCRINOLOGIC: No reports of sweating, cold or heat intolerance. No polyuria or polydipsia.  [name]   Physical Examination Vitals:   03/22/18 0837 03/22/18 0839  BP: 92/60 98/60  Pulse: 69   SpO2: 98%    Vitals:   03/22/18 0837  Weight: 194 lb (88 kg)  Height: '5\' 10"'  (1.778 m)    Gen: resting comfortably, no acute distress HEENT: no scleral icterus, pupils equal round and reactive, no palptable cervical adenopathy,  CV: RRR, no m/r/g, no jvd Resp: Clear to auscultation bilaterally  GI: abdomen is soft, non-tender, non-distended, normal bowel sounds, no hepatosplenomegaly MSK: extremities are warm, no edema.  Skin: warm, no rash Neuro:  no focal deficits Psych: appropriate affect   Assessment and Plan  1. Irregular heart beat - fairly minimal symptoms, but on several occasions by health care evaluations or by his watch informed of irregular heart beats - we will obtain 48 hr holter to further evaluate - wean caffeine. Normal K recently, if significant ectopy would need to make sure his TSH has been normal as well  F/u pending monitor  results    Alex Weaver, M.D.

## 2018-03-22 NOTE — Patient Instructions (Signed)
Medication Instructions:  Your physician recommends that you continue on your current medications as directed. Please refer to the Current Medication list given to you today.   Labwork: none  Testing/Procedures: Your physician has recommended that you wear a holter monitor. Holter monitors are medical devices that record the heart's electrical activity. Doctors most often use these monitors to diagnose arrhythmias. Arrhythmias are problems with the speed or rhythm of the heartbeat. The monitor is a small, portable device. You can wear one while you do your normal daily activities. This is usually used to diagnose what is causing palpitations/syncope (passing out).    Follow-Up: Your physician recommends that you schedule a follow-up appointment in: pending results of holter    Any Other Special Instructions Will Be Listed Below (If Applicable).     If you need a refill on your cardiac medications before your next appointment, please call your pharmacy.

## 2018-04-09 ENCOUNTER — Telehealth: Payer: Self-pay | Admitting: Cardiology

## 2018-04-09 DIAGNOSIS — R9431 Abnormal electrocardiogram [ECG] [EKG]: Secondary | ICD-10-CM

## 2018-04-09 NOTE — Telephone Encounter (Signed)
Called pt. Advised him that his monitor has not been resulted as of yet, but I will let doctor know it has been uploaded.

## 2018-04-09 NOTE — Telephone Encounter (Signed)
Would like results from monitor and to know when he needs to f/u again.

## 2018-04-16 NOTE — Telephone Encounter (Signed)
Pt is calling back for his monitor results, states he still hasn't heard back from them and he has a colonoscopy scheduled fro Friday and wants to make sure everything was ok with the monitor.

## 2018-04-16 NOTE — Telephone Encounter (Signed)
Echo scheduled 04/20/2018 at 245 pm at [facility], patient aware

## 2018-04-16 NOTE — Telephone Encounter (Signed)
Monitor shows just some occasional extra heart beats, sometimes he will have a few in a row. This is likely benign but in some cases can suggest some underlying heart conditions. Can he get an echo for PVCs, tomorrow if possible to try to clear him for Friday. We can always have GI reschedule if needed if we need to, but would get echo prior to colonscopy.    Zandra Abts MD

## 2018-04-16 NOTE — Telephone Encounter (Signed)
I will forward to Dr.Branch for review. 

## 2018-04-17 ENCOUNTER — Telehealth: Payer: Self-pay | Admitting: Internal Medicine

## 2018-04-17 ENCOUNTER — Ambulatory Visit (HOSPITAL_COMMUNITY)
Admission: RE | Admit: 2018-04-17 | Discharge: 2018-04-17 | Disposition: A | Discharge status: Home / Self Care | Payer: BLUE CROSS/BLUE SHIELD | Source: Ambulatory Visit | Attending: Family Medicine | Admitting: Family Medicine

## 2018-04-17 DIAGNOSIS — R9431 Abnormal electrocardiogram [ECG] [EKG]: Secondary | ICD-10-CM | POA: Diagnosis not present

## 2018-04-17 NOTE — Telephone Encounter (Signed)
Pt is scheduled for colonoscopy with RMR on 04/19/2018. He asked to speak with JL. Please call him at [phone]

## 2018-04-17 NOTE — Telephone Encounter (Signed)
Spoke with the pt and answered his questions.

## 2018-04-17 NOTE — Progress Notes (Signed)
*  PRELIMINARY RESULTS* Echocardiogram 2D Echocardiogram has been performed.  Alex Weaver 04/17/2018, 3:31 PM

## 2018-04-19 ENCOUNTER — Ambulatory Visit (HOSPITAL_COMMUNITY)
Admission: RE | Admit: 2018-04-19 | Discharge: 2018-04-19 | Disposition: A | Discharge status: Home / Self Care | Payer: BLUE CROSS/BLUE SHIELD | Source: Ambulatory Visit | Attending: Internal Medicine | Admitting: Internal Medicine

## 2018-04-19 ENCOUNTER — Other Ambulatory Visit: Payer: Self-pay

## 2018-04-19 ENCOUNTER — Encounter (HOSPITAL_COMMUNITY)
Admission: RE | Disposition: A | Discharge status: Home / Self Care | Payer: Self-pay | Source: Ambulatory Visit | Attending: Internal Medicine

## 2018-04-19 ENCOUNTER — Encounter (HOSPITAL_COMMUNITY): Payer: Self-pay | Admitting: *Deleted

## 2018-04-19 DIAGNOSIS — F329 Major depressive disorder, single episode, unspecified: Secondary | ICD-10-CM | POA: Insufficient documentation

## 2018-04-19 DIAGNOSIS — D120 Benign neoplasm of cecum: Secondary | ICD-10-CM | POA: Insufficient documentation

## 2018-04-19 DIAGNOSIS — Z86010 Personal history of colonic polyps: Secondary | ICD-10-CM | POA: Insufficient documentation

## 2018-04-19 DIAGNOSIS — Z79899 Other long term (current) drug therapy: Secondary | ICD-10-CM | POA: Diagnosis not present

## 2018-04-19 DIAGNOSIS — Z1211 Encounter for screening for malignant neoplasm of colon: Secondary | ICD-10-CM | POA: Insufficient documentation

## 2018-04-19 DIAGNOSIS — K5730 Diverticulosis of large intestine without perforation or abscess without bleeding: Secondary | ICD-10-CM | POA: Insufficient documentation

## 2018-04-19 DIAGNOSIS — G4700 Insomnia, unspecified: Secondary | ICD-10-CM | POA: Diagnosis not present

## 2018-04-19 DIAGNOSIS — Z87891 Personal history of nicotine dependence: Secondary | ICD-10-CM | POA: Insufficient documentation

## 2018-04-19 HISTORY — PX: COLONOSCOPY: SHX5424

## 2018-04-19 HISTORY — PX: POLYPECTOMY: SHX5525

## 2018-04-19 HISTORY — DX: Cardiac arrhythmia, unspecified: I49.9

## 2018-04-19 SURGERY — COLONOSCOPY
Anesthesia: Moderate Sedation

## 2018-04-19 MED ORDER — MIDAZOLAM HCL 5 MG/5ML IJ SOLN
INTRAMUSCULAR | Status: AC
  Filled 2018-04-19: qty 10

## 2018-04-19 MED ORDER — SODIUM CHLORIDE 0.9 % IV SOLN
INTRAVENOUS | Status: DC
  Administered 2018-04-19: 08:00:00 via INTRAVENOUS

## 2018-04-19 MED ORDER — ONDANSETRON HCL 4 MG/2ML IJ SOLN
INTRAMUSCULAR | Status: DC | PRN
  Administered 2018-04-19: 4 mg via INTRAVENOUS

## 2018-04-19 MED ORDER — MEPERIDINE HCL 50 MG/ML IJ SOLN
INTRAMUSCULAR | Status: AC
  Filled 2018-04-19: qty 1

## 2018-04-19 MED ORDER — MIDAZOLAM HCL 5 MG/5ML IJ SOLN
INTRAMUSCULAR | Status: DC | PRN
  Administered 2018-04-19: 2 mg via INTRAVENOUS
  Administered 2018-04-19 (×3): 1 mg via INTRAVENOUS

## 2018-04-19 MED ORDER — ONDANSETRON HCL 4 MG/2ML IJ SOLN
INTRAMUSCULAR | Status: AC
  Filled 2018-04-19: qty 2

## 2018-04-19 MED ORDER — MEPERIDINE HCL 100 MG/ML IJ SOLN
INTRAMUSCULAR | Status: DC | PRN
  Administered 2018-04-19: 25 mg via INTRAVENOUS
  Administered 2018-04-19: 15 mg via INTRAVENOUS

## 2018-04-19 MED ORDER — STERILE WATER FOR IRRIGATION IR SOLN
Status: DC | PRN
  Administered 2018-04-19: 08:00:00

## 2018-04-19 NOTE — H&P (Signed)
'@LOGO' @   Primary Care Physician:  Sharilyn Sites, MD Primary Gastroenterologist:  Dr. Gala Romney  Pre-Procedure History & Physical: HPI:  Alex Weaver is a 58 y.o. male here for surveillance colonoscopy.  History of colonic adenoma removed 5 years ago.  Past Medical History:  Diagnosis Date  . Bradycardia   . Depression   . Dysrhythmia    echocardiogram 04/17/2018  . Insomnia   . Memory loss   . Polyp of colon     Past Surgical History:  Procedure Laterality Date  . bilateral knee surgery Bilateral    bilateral acl repair  . COLONOSCOPY N/A 01/17/2013   Procedure: COLONOSCOPY;  Surgeon: Daneil Dolin, MD;  Location: [facility];  Service: Endoscopy;  Laterality: N/A;  8:15 AM  . HERNIA REPAIR Left    inguinal    Prior to Admission medications   Medication Sig Start Date End Date Taking? Authorizing Provider  escitalopram (LEXAPRO) 20 MG tablet Take 20 mg by mouth every other day. In the morning.   Yes [provider]  GLUCOSAMINE-CHONDROITIN DS PO Take 1 tablet by mouth daily.   Yes [provider]  Multiple Vitamin (MULTIVITAMIN WITH MINERALS) TABS tablet Take 1 tablet by mouth daily.   Yes [provider]  Na Sulfate-K Sulfate-Mg Sulf (SUPREP BOWEL PREP KIT) 17.5-3.13-1.6 GM/177ML SOLN Take 1 kit by mouth as directed. 01/30/18  Yes Annitta Needs, NP  temazepam (RESTORIL) 30 MG capsule Take 30 mg by mouth every other day. At bedtime.   Yes [provider]    Allergies as of 01/30/2018  . (No Known Allergies)    Family History  Problem Relation Age of Onset  . Dementia Mother   . Lung cancer Father   . Diabetes Father     Social History   Socioeconomic History  . Marital status: Married    Spouse name: Not on file  . Number of children: 1  . Years of education: 27  . Highest education level: Bachelor's degree (e.g., BA, AB, BS)  Occupational History  . Occupation: Facilities manager  Social Needs  . Financial resource  strain: Not on file  . Food insecurity:    Worry: Not on file    Inability: Not on file  . Transportation needs:    Medical: Not on file    Non-medical: Not on file  Tobacco Use  . Smoking status: Former Smoker    Types: Cigarettes  . Smokeless tobacco: Never Used  Substance and Sexual Activity  . Alcohol use: Yes    Comment: occasional   . Drug use: Never  . Sexual activity: Not on file  Lifestyle  . Physical activity:    Days per week: Not on file    Minutes per session: Not on file  . Stress: Not on file  Relationships  . Social connections:    Talks on phone: Not on file    Gets together: Not on file    Attends religious service: Not on file    Active member of club or organization: Not on file    Attends meetings of clubs or organizations: Not on file    Relationship status: Not on file  . Intimate partner violence:    Fear of current or ex partner: Not on file    Emotionally abused: Not on file    Physically abused: Not on file    Forced sexual activity: Not on file  Other Topics Concern  . Not on file  Social History Narrative   Caffeine use: 2 cups per day.   Right-handed.   Lives at home with his wife.    Review of Systems: See HPI, otherwise negative ROS  Physical Exam: BP 104/77   Pulse 67   Temp 97.8 F (36.6 C) (Oral)   Resp 17   Ht '5\' 10"'  (1.778 m)   Wt 83.9 kg   SpO2 94%   BMI 26.54 kg/m  General:   Alert,  Well-developed, well-nourished, pleasant and cooperative in NAD Neck:  Supple; no masses or thyromegaly. No significant cervical adenopathy. Lungs:  Clear throughout to auscultation.   No wheezes, crackles, or rhonchi. No acute distress. Heart:  Regular rate and rhythm; no murmurs, clicks, rubs,  or gallops. Abdomen: Non-distended, normal bowel sounds.  Soft and nontender without appreciable mass or hepatosplenomegaly.  Pulses:  Normal pulses noted. Extremities:  Without clubbing or edema.  Impression/Plan: Pleasant 58 year old  gentleman history of colonic adenoma.  Here for surveillance colonoscopy. The risks, benefits, limitations, alternatives and imponderables have been reviewed with the patient. Questions have been answered. All parties are agreeable.      Notice: This dictation was prepared with Dragon dictation along with smaller phrase technology. Any transcriptional errors that result from this process are unintentional and may not be corrected upon review.

## 2018-04-19 NOTE — Op Note (Signed)
[facility] Patient Name: Alex Weaver Procedure Date: 04/19/2018 8:07 AM MRN: [id number] Date of Birth: July 05, 1960 Attending MD: Norvel Richards , MD CSN: [id number] Age: 58 Admit Type: Outpatient Procedure:                Colonoscopy Indications:              High risk colon cancer surveillance: Personal                            history of colonic polyps Providers:                Norvel Richards, MD, Otis Peak B. Sharon Seller, RN,                            Nelma Rothman, Technician Referring MD:              Medicines:                Midazolam 5 mg IV, Meperidine 40 mg IV Complications:            No immediate complications. Estimated Blood Loss:     Estimated blood loss was minimal. Estimated blood                            loss was minimal. Procedure:                Pre-Anesthesia Assessment:                           - Prior to the procedure, a History and Physical                            was performed, and patient medications and                            allergies were reviewed. The patient's tolerance of                            previous anesthesia was also reviewed. The risks                            and benefits of the procedure and the sedation                            options and risks were discussed with the patient.                            All questions were answered, and informed consent                            was obtained. Prior Anticoagulants: The patient has                            taken no previous anticoagulant or antiplatelet  agents. ASA Grade Assessment: II - A patient with                            mild systemic disease. After reviewing the risks                            and benefits, the patient was deemed in                            satisfactory condition to undergo the procedure.                           After obtaining informed consent, the colonoscope                            was passed under  direct vision. Throughout the                            procedure, the patient's blood pressure, pulse, and                            oxygen saturations were monitored continuously. The                            CF-[id number] ([id number]) scope was introduced through                            the anus and advanced to the the cecum, identified                            by appendiceal orifice and ileocecal valve. The                            colonoscopy was performed without difficulty. The                            patient tolerated the procedure well. The quality                            of the bowel preparation was adequate. The                            ileocecal valve, appendiceal orifice, and rectum                            were photographed. The entire colon was well                            visualized. Scope In: 8:21:31 AM Scope Out: 8:34:42 AM Scope Withdrawal Time: 0 hours 8 minutes 50 seconds  Total Procedure Duration: 0 hours 13 minutes 11 seconds  Findings:      The perianal and digital rectal examinations were normal.      A few medium-mouthed diverticula were found in the sigmoid colon  and       descending colon.      A 6 mm polyp was found in the ileocecal valve. The polyp was sessile.       The polyp was removed with a cold snare. Resection and retrieval were       complete. Estimated blood loss was minimal.      The exam was otherwise without abnormality on direct and retroflexion       views. Impression:               - Diverticulosis in the sigmoid colon and in the                            descending colon.                           - One 6 mm polyp at the ileocecal valve, removed                            with a cold snare. Resected and retrieved.                           - The examination was otherwise normal on direct                            and retroflexion views. Moderate Sedation:      Moderate (conscious) sedation was administered by the  endoscopy nurse       and supervised by the endoscopist. The following parameters were       monitored: oxygen saturation, heart rate, blood pressure, respiratory       rate, EKG, adequacy of pulmonary ventilation, and response to care.       Total physician intraservice time was 16 minutes. Recommendation:           - Patient has a contact number available for                            emergencies. The signs and symptoms of potential                            delayed complications were discussed with the                            patient. Return to normal activities tomorrow.                            Written discharge instructions were provided to the                            patient.                           - Resume previous diet.                           - Continue present medications.                           -  Repeat colonoscopy date to be determined after                            pending pathology results are reviewed for                            surveillance based on pathology results.                           - Return to GI office (date not yet determined). Procedure Code(s):        --- Professional ---                           [phone], Colonoscopy, flexible; with removal of                            tumor(s), polyp(s), or other lesion(s) by snare                            technique                           G0500, Moderate sedation services provided by the                            same physician or other qualified health care                            professional performing a gastrointestinal                            endoscopic service that sedation supports,                            requiring the presence of an independent trained                            observer to assist in the monitoring of the                            patient's level of consciousness and physiological                            status; initial 15 minutes of intra-service time;                             patient age 52 years or older (additional time may                            be reported with [phone], as appropriate) Diagnosis Code(s):        --- Professional ---                           Z86.010, Personal history of colonic polyps  D12.0, Benign neoplasm of cecum                           K57.30, Diverticulosis of large intestine without                            perforation or abscess without bleeding CPT copyright 2018 American [employer] Association. All rights reserved. The codes documented in this report are preliminary and upon coder review may  be revised to meet current compliance requirements. Cristopher Estimable. Glennis Borger, MD Norvel Richards, MD 04/19/2018 8:42:08 AM This report has been signed electronically. Number of Addenda: 0

## 2018-04-19 NOTE — Discharge Instructions (Signed)
°  Colonoscopy Discharge Instructions  Read the instructions outlined below and refer to this sheet in the next few weeks. These discharge instructions provide you with general information on caring for yourself after you leave the hospital. Your doctor may also give you specific instructions. While your treatment has been planned according to the most current medical practices available, unavoidable complications occasionally occur. If you have any problems or questions after discharge, call Dr. Gala Romney at [phone]. ACTIVITY  You may resume your regular activity, but move at a slower pace for the next 24 hours.   Take frequent rest periods for the next 24 hours.   Walking will help get rid of the air and reduce the bloated feeling in your belly (abdomen).   No driving for 24 hours (because of the medicine (anesthesia) used during the test).    Do not sign any important legal documents or operate any machinery for 24 hours (because of the anesthesia used during the test).  NUTRITION  Drink plenty of fluids.   You may resume your normal diet as instructed by your doctor.   Begin with a light meal and progress to your normal diet. Heavy or fried foods are harder to digest and may make you feel sick to your stomach (nauseated).   Avoid alcoholic beverages for 24 hours or as instructed.  MEDICATIONS  You may resume your normal medications unless your doctor tells you otherwise.  WHAT YOU CAN EXPECT TODAY  Some feelings of bloating in the abdomen.   Passage of more gas than usual.   Spotting of blood in your stool or on the toilet paper.  IF YOU HAD POLYPS REMOVED DURING THE COLONOSCOPY:  No aspirin products for 7 days or as instructed.   No alcohol for 7 days or as instructed.   Eat a soft diet for the next 24 hours.  FINDING OUT THE RESULTS OF YOUR TEST Not all test results are available during your visit. If your test results are not back during the visit, make an appointment  with your caregiver to find out the results. Do not assume everything is normal if you have not heard from your caregiver or the medical facility. It is important for you to follow up on all of your test results.  SEEK IMMEDIATE MEDICAL ATTENTION IF:  You have more than a spotting of blood in your stool.   Your belly is swollen (abdominal distention).   You are nauseated or vomiting.   You have a temperature over 101.   You have abdominal pain or discomfort that is severe or gets worse throughout the day.    Colon polyp diverticulosis information provided  Further recommendations to follow pending review of pathology report

## 2018-04-22 ENCOUNTER — Encounter: Payer: Self-pay | Admitting: Internal Medicine

## 2018-04-25 ENCOUNTER — Encounter (HOSPITAL_COMMUNITY): Payer: Self-pay | Admitting: Internal Medicine

## 2018-07-02 ENCOUNTER — Telehealth: Payer: Self-pay | Admitting: Cardiology

## 2018-07-02 DIAGNOSIS — R001 Bradycardia, unspecified: Secondary | ICD-10-CM | POA: Diagnosis not present

## 2018-07-02 DIAGNOSIS — Z0000 Encounter for general adult medical examination without abnormal findings: Secondary | ICD-10-CM | POA: Diagnosis not present

## 2018-07-02 DIAGNOSIS — R7309 Other abnormal glucose: Secondary | ICD-10-CM | POA: Diagnosis not present

## 2018-07-02 DIAGNOSIS — G4700 Insomnia, unspecified: Secondary | ICD-10-CM | POA: Diagnosis not present

## 2018-07-02 DIAGNOSIS — Z6827 Body mass index (BMI) 27.0-27.9, adult: Secondary | ICD-10-CM | POA: Diagnosis not present

## 2018-07-02 DIAGNOSIS — Z1389 Encounter for screening for other disorder: Secondary | ICD-10-CM | POA: Diagnosis not present

## 2018-07-02 NOTE — Telephone Encounter (Signed)
° ° °  Virtual Visit Pre-Appointment Phone Call  Steps For Call:  1. Confirm consent - "In the setting of the current Covid19 crisis, you are scheduled for a (phone or video) visit with your provider on (date) at (time).  Just as we do with many in-office visits, in order for you to participate in this visit, we must obtain consent.  If you'd like, I can send this to your mychart (if signed up) or email for you to review.  Otherwise, I can obtain your verbal consent now.  All virtual visits are billed to your insurance company just like a normal visit would be.  By agreeing to a virtual visit, we'd like you to understand that the technology does not allow for your provider to perform an examination, and thus may limit your provider's ability to fully assess your condition. If your provider identifies any concerns that need to be evaluated in person, we will make arrangements to do so.  Finally, though the technology is pretty good, we cannot assure that it will always work on either your or our end, and in the setting of a video visit, we may have to convert it to a phone-only visit.  In either situation, we cannot ensure that we have a secure connection.  Are you willing to proceed?" STAFF: Did the patient verbally acknowledge consent to telehealth visit? Document YES/NO here: Yes  2. Confirm the BEST phone number to call the day of the visit by including in appointment notes  3. Give patient instructions for MyChart download to smartphone OR [email or web address] as below if video visit (depending on what platform provider is using)  4. Confirm that appointment type is correct in Epic appointment notes (VIDEO vs PHONE)  5. Advise patient to be prepared with their blood pressure, heart rate, weight, any heart rhythm information, their current medicines, and a piece of paper and pen handy for any instructions they may receive the day of their visit  6. Inform patient they will receive a phone call 15 minutes  prior to their appointment time (may be from unknown caller ID) so they should be prepared to answer    TELEPHONE CALL NOTE  Alex Weaver has been deemed a candidate for a follow-up tele-health visit to limit community exposure during the Covid-19 pandemic. I spoke with the patient via phone to ensure availability of phone/video source, confirm preferred email & phone number, and discuss instructions and expectations.  I reminded Alex Weaver to be prepared with any vital sign and/or heart rhythm information that could potentially be obtained via home monitoring, at the time of his visit. I reminded Alex Weaver to expect a phone call prior to his visit.  Bertram Gala Goins 07/02/2018 9:24 AM

## 2018-07-03 ENCOUNTER — Encounter: Payer: Self-pay | Admitting: Cardiology

## 2018-07-03 ENCOUNTER — Telehealth (INDEPENDENT_AMBULATORY_CARE_PROVIDER_SITE_OTHER): Payer: BLUE CROSS/BLUE SHIELD | Admitting: Cardiology

## 2018-07-03 VITALS — BP 102/70 | HR 61 | Ht 70.0 in | Wt 190.0 lb

## 2018-07-03 DIAGNOSIS — I493 Ventricular premature depolarization: Secondary | ICD-10-CM

## 2018-07-03 DIAGNOSIS — I499 Cardiac arrhythmia, unspecified: Secondary | ICD-10-CM

## 2018-07-03 NOTE — Progress Notes (Signed)
Medication Instructions:  Your physician recommends that you continue on your current medications as directed. Please refer to the Current Medication list given to you today.   Labwork: I will request labs from pcp  Testing/Procedures: I will request EKG from pcp  Follow-Up: Your physician wants you to follow-up in: 1 year.You will receive a reminder letter in the mail two months in advance. If you don't receive a letter, please call our office to schedule the follow-up appointment.   Any Other Special Instructions Will Be Listed Below (If Applicable).     If you need a refill on your cardiac medications before your next appointment, please call your pharmacy.

## 2018-07-03 NOTE — Progress Notes (Signed)
Virtual Visit via Video Note   This visit type was conducted due to national recommendations for restrictions regarding the COVID-19 Pandemic (e.g. social distancing) in an effort to limit this patient's exposure and mitigate transmission in our community.  Due to his co-morbid illnesses, this patient is at least at moderate risk for complications without adequate follow up.  This format is felt to be most appropriate for this patient at this time.  All issues noted in this document were discussed and addressed.  A limited physical exam was performed with this format.  Please refer to the patient's chart for his consent to telehealth for [facility].   Evaluation Performed:  Follow-up visit  Date:  07/03/2018   ID:  Alex Weaver, DOB [birth year], MRN [id number]  Patient Location: Home Provider Location: Home  PCP:  Sharilyn Sites, MD  Cardiologist:  Carlyle Dolly, MD  Electrophysiologist:  None   Chief Complaint:  PVCs/Palpitatoins  History of Present Illness:    Alex Weaver is a 58 y.o. male seen today for follow up of the following medical problems.    1.Irregular heart/PVCs  - minimal feelings of palpitations. Told in the past he has had some irregular beats, at previous doctor visits and also 2 different times while going to donate blood where he was told his pulse was "irregular". Notes some high heart rates with lower levels of exercise than usual for him. Told of irregular pulse.   - coffee x 2-3 cups, occasional tea, no sodas, no energy drinks, drinks on weekends.   01/2018 labs with pcp: K 4.3,  - pcp EKG showed NSR   Jan 2020 holter Infrequent ventricular ectopy primarily as isolated PVCs, bigeminy, trigeminy. One 3 beat run of NSVT Echo normal LVEF, no WMAs - denies any recent palpitaitons.     SH: works Chiropodist  The patient does not have symptoms concerning for COVID-19 infection (fever, chills, cough, or new shortness of  breath).    Past Medical History:  Diagnosis Date  . Bradycardia   . Depression   . Dysrhythmia    echocardiogram 04/17/2018  . Insomnia   . Memory loss   . Polyp of colon    Past Surgical History:  Procedure Laterality Date  . bilateral knee surgery Bilateral    bilateral acl repair  . COLONOSCOPY N/A 01/17/2013   Procedure: COLONOSCOPY;  Surgeon: Daneil Dolin, MD;  [facility];  Service: Endoscopy;  Laterality: N/A;  8:15 AM  . COLONOSCOPY N/A 04/19/2018   Procedure: COLONOSCOPY;  Surgeon: Daneil Dolin, MD;  Location: [facility];  Service: Endoscopy;  Laterality: N/A;  8:30  . HERNIA REPAIR Left    inguinal  . POLYPECTOMY  04/19/2018   Procedure: POLYPECTOMY;  Surgeon: Daneil Dolin, MD;  Location: [facility];  Service: Endoscopy;;  colon     Current Meds  Medication Sig  . escitalopram (LEXAPRO) 20 MG tablet Take 20 mg by mouth every other day. In the morning.  [name] GLUCOSAMINE-CHONDROITIN DS PO Take 1 tablet by mouth daily.  . Multiple Vitamin (MULTIVITAMIN WITH MINERALS) TABS tablet Take 1 tablet by mouth daily.  . temazepam (RESTORIL) 30 MG capsule Take 30 mg by mouth every other day. At bedtime.     Allergies:   Patient has no known allergies.   Social History   Tobacco Use  . Smoking status: Former Smoker    Types: Cigarettes  . Smokeless tobacco: Never Used  Substance  Use Topics  . Alcohol use: Yes    Comment: occasional   . Drug use: Never     Family Hx: The patient's family history includes Dementia in his mother; Diabetes in his father; Lung cancer in his father.  ROS:   Please see the history of present illness.    All other systems reviewed and are negative.   Prior CV studies:   The following studies were reviewed today:  Jan 2020 48 hr holter  48 hour monitor  Min HR 53, Max HR 149, Avg HR 80  Infrequent ventricular ectopy primarily as isolated PVCs, bigeminy, trigeminy. One 3 beat run of NSVT  Rare supraventricular  ectopy all in the form of isolated PACs  No symptoms reported   04/2018 echo   Labs/Other Tests and Data Reviewed:    EKG:  na  Recent Labs: No results found for requested labs within last 8760 hours.   Recent Lipid Panel No results found for: CHOL, TRIG, HDL, CHOLHDL, LDLCALC, LDLDIRECT  Wt Readings from Last 3 Encounters:  04/19/18 185 lb (83.9 kg)  03/22/18 194 lb (88 kg)  08/14/17 195 lb (88.5 kg)     Objective:    Vital Signs:  p 61 bp 102/70 Well nourished male sitting comfortable in no distress Normal affect. Normal speech pattern and tone. No audible or visual signs of SOB or wheezing.   ASSESSMENT & PLAN:    1. Irregular heart beat/PVCs - monitor with occasional PVCs, echo without underlysing structural heart disease. He is asymptomatic - continue to monitor, asymptomatic and thus would not start av nodal agent at this time     COVID-19 Education: The signs and symptoms of COVID-19 were discussed with the patient and how to seek care for testing (follow up with PCP or arrange E-visit).  The importance of social distancing was discussed today.  Time:   Today, I have spent 15 minutes with the patient with telehealth technology discussing the above problems.     Medication Adjustments/Labs and Tests Ordered: Current medicines are reviewed at length with the patient today.  Concerns regarding medicines are outlined above.   Tests Ordered: No orders of the defined types were placed in this encounter.   Medication Changes: No orders of the defined types were placed in this encounter.   Disposition:  Follow up 15 minutes  Signed, Carlyle Dolly, MD  07/03/2018 12:40 PM    [place]

## 2018-07-05 DIAGNOSIS — M25561 Pain in right knee: Secondary | ICD-10-CM | POA: Diagnosis not present

## 2018-07-08 ENCOUNTER — Telehealth: Payer: Self-pay | Admitting: Internal Medicine

## 2018-07-08 NOTE — Telephone Encounter (Signed)
Patient called with a question about his bill [phone].  Said he thought this was covered at 100% and wants to know if this can be re-coded so he will not be charged, or wants to know if this is somehow what he owes

## 2018-07-08 NOTE — Telephone Encounter (Signed)
I will send an e-mail to the billing department and have them reach out to the patient.

## 2018-07-24 ENCOUNTER — Ambulatory Visit: Payer: BLUE CROSS/BLUE SHIELD | Admitting: Cardiology

## 2018-10-09 DIAGNOSIS — M25561 Pain in right knee: Secondary | ICD-10-CM | POA: Diagnosis not present

## 2018-10-28 DIAGNOSIS — Z681 Body mass index (BMI) 19 or less, adult: Secondary | ICD-10-CM | POA: Diagnosis not present

## 2018-10-28 DIAGNOSIS — Z01810 Encounter for preprocedural cardiovascular examination: Secondary | ICD-10-CM | POA: Diagnosis not present

## 2018-10-28 DIAGNOSIS — R7309 Other abnormal glucose: Secondary | ICD-10-CM | POA: Diagnosis not present

## 2018-10-28 DIAGNOSIS — R001 Bradycardia, unspecified: Secondary | ICD-10-CM | POA: Diagnosis not present

## 2018-10-28 DIAGNOSIS — Z23 Encounter for immunization: Secondary | ICD-10-CM | POA: Diagnosis not present

## 2018-12-17 DIAGNOSIS — Z20828 Contact with and (suspected) exposure to other viral communicable diseases: Secondary | ICD-10-CM | POA: Diagnosis not present

## 2019-01-01 DIAGNOSIS — M1711 Unilateral primary osteoarthritis, right knee: Secondary | ICD-10-CM | POA: Diagnosis not present

## 2019-01-01 DIAGNOSIS — M25561 Pain in right knee: Secondary | ICD-10-CM | POA: Diagnosis not present

## 2019-01-16 DIAGNOSIS — M1711 Unilateral primary osteoarthritis, right knee: Secondary | ICD-10-CM | POA: Diagnosis not present

## 2019-01-16 HISTORY — PX: OTHER SURGICAL HISTORY: SHX169

## 2019-02-13 DIAGNOSIS — G4700 Insomnia, unspecified: Secondary | ICD-10-CM | POA: Diagnosis not present

## 2019-02-27 DIAGNOSIS — Z20828 Contact with and (suspected) exposure to other viral communicable diseases: Secondary | ICD-10-CM | POA: Diagnosis not present

## 2019-04-09 DIAGNOSIS — Z20822 Contact with and (suspected) exposure to covid-19: Secondary | ICD-10-CM | POA: Diagnosis not present

## 2019-05-11 ENCOUNTER — Ambulatory Visit: Payer: BC Managed Care – PPO | Attending: Internal Medicine

## 2019-05-11 DIAGNOSIS — Z23 Encounter for immunization: Secondary | ICD-10-CM | POA: Insufficient documentation

## 2019-06-14 ENCOUNTER — Ambulatory Visit: Payer: BC Managed Care – PPO | Attending: Internal Medicine

## 2019-06-14 DIAGNOSIS — Z23 Encounter for immunization: Secondary | ICD-10-CM

## 2019-06-14 NOTE — Progress Notes (Signed)
   Covid-19 Vaccination Clinic  Name:  Alex Weaver    MRN: [id number] DOB: 1961/02/10  06/14/2019  Alex Weaver was observed post Covid-19 immunization for 15 minutes without incident. He was provided with Vaccine Information Sheet and instruction to access the V-Safe system.   Alex Weaver was instructed to call 911 with any severe reactions post vaccine: [name] Difficulty breathing  . Swelling of face and throat  . A fast heartbeat  . A bad rash all over body  . Dizziness and weakness   Immunizations Administered    Name Date Dose VIS Date Route   Moderna COVID-19 Vaccine 06/14/2019 12:41 PM 0.5 mL 02/11/2019 Intramuscular   Manufacturer: Moderna   Lot: [id number]   [id number]

## 2019-06-30 DIAGNOSIS — M546 Pain in thoracic spine: Secondary | ICD-10-CM | POA: Diagnosis not present

## 2019-06-30 DIAGNOSIS — M50322 Other cervical disc degeneration at C5-C6 level: Secondary | ICD-10-CM | POA: Diagnosis not present

## 2019-06-30 DIAGNOSIS — M9902 Segmental and somatic dysfunction of thoracic region: Secondary | ICD-10-CM | POA: Diagnosis not present

## 2019-06-30 DIAGNOSIS — M9901 Segmental and somatic dysfunction of cervical region: Secondary | ICD-10-CM | POA: Diagnosis not present

## 2019-07-03 DIAGNOSIS — M50322 Other cervical disc degeneration at C5-C6 level: Secondary | ICD-10-CM | POA: Diagnosis not present

## 2019-07-03 DIAGNOSIS — M9901 Segmental and somatic dysfunction of cervical region: Secondary | ICD-10-CM | POA: Diagnosis not present

## 2019-07-03 DIAGNOSIS — M546 Pain in thoracic spine: Secondary | ICD-10-CM | POA: Diagnosis not present

## 2019-07-03 DIAGNOSIS — M9902 Segmental and somatic dysfunction of thoracic region: Secondary | ICD-10-CM | POA: Diagnosis not present

## 2019-07-07 DIAGNOSIS — M546 Pain in thoracic spine: Secondary | ICD-10-CM | POA: Diagnosis not present

## 2019-07-07 DIAGNOSIS — M50322 Other cervical disc degeneration at C5-C6 level: Secondary | ICD-10-CM | POA: Diagnosis not present

## 2019-07-07 DIAGNOSIS — M9902 Segmental and somatic dysfunction of thoracic region: Secondary | ICD-10-CM | POA: Diagnosis not present

## 2019-07-07 DIAGNOSIS — M9901 Segmental and somatic dysfunction of cervical region: Secondary | ICD-10-CM | POA: Diagnosis not present

## 2019-07-09 DIAGNOSIS — M9901 Segmental and somatic dysfunction of cervical region: Secondary | ICD-10-CM | POA: Diagnosis not present

## 2019-07-09 DIAGNOSIS — M50322 Other cervical disc degeneration at C5-C6 level: Secondary | ICD-10-CM | POA: Diagnosis not present

## 2019-07-09 DIAGNOSIS — M9902 Segmental and somatic dysfunction of thoracic region: Secondary | ICD-10-CM | POA: Diagnosis not present

## 2019-07-09 DIAGNOSIS — M546 Pain in thoracic spine: Secondary | ICD-10-CM | POA: Diagnosis not present

## 2019-07-10 DIAGNOSIS — M9901 Segmental and somatic dysfunction of cervical region: Secondary | ICD-10-CM | POA: Diagnosis not present

## 2019-07-10 DIAGNOSIS — M9902 Segmental and somatic dysfunction of thoracic region: Secondary | ICD-10-CM | POA: Diagnosis not present

## 2019-07-10 DIAGNOSIS — M50322 Other cervical disc degeneration at C5-C6 level: Secondary | ICD-10-CM | POA: Diagnosis not present

## 2019-07-10 DIAGNOSIS — M546 Pain in thoracic spine: Secondary | ICD-10-CM | POA: Diagnosis not present

## 2019-07-14 DIAGNOSIS — M50322 Other cervical disc degeneration at C5-C6 level: Secondary | ICD-10-CM | POA: Diagnosis not present

## 2019-07-14 DIAGNOSIS — M9901 Segmental and somatic dysfunction of cervical region: Secondary | ICD-10-CM | POA: Diagnosis not present

## 2019-07-14 DIAGNOSIS — M546 Pain in thoracic spine: Secondary | ICD-10-CM | POA: Diagnosis not present

## 2019-07-14 DIAGNOSIS — M9902 Segmental and somatic dysfunction of thoracic region: Secondary | ICD-10-CM | POA: Diagnosis not present

## 2019-07-16 DIAGNOSIS — M546 Pain in thoracic spine: Secondary | ICD-10-CM | POA: Diagnosis not present

## 2019-07-16 DIAGNOSIS — M9901 Segmental and somatic dysfunction of cervical region: Secondary | ICD-10-CM | POA: Diagnosis not present

## 2019-07-16 DIAGNOSIS — M50322 Other cervical disc degeneration at C5-C6 level: Secondary | ICD-10-CM | POA: Diagnosis not present

## 2019-07-16 DIAGNOSIS — M9902 Segmental and somatic dysfunction of thoracic region: Secondary | ICD-10-CM | POA: Diagnosis not present

## 2019-07-17 DIAGNOSIS — M546 Pain in thoracic spine: Secondary | ICD-10-CM | POA: Diagnosis not present

## 2019-07-17 DIAGNOSIS — M9901 Segmental and somatic dysfunction of cervical region: Secondary | ICD-10-CM | POA: Diagnosis not present

## 2019-07-17 DIAGNOSIS — M50322 Other cervical disc degeneration at C5-C6 level: Secondary | ICD-10-CM | POA: Diagnosis not present

## 2019-07-17 DIAGNOSIS — M9902 Segmental and somatic dysfunction of thoracic region: Secondary | ICD-10-CM | POA: Diagnosis not present

## 2019-07-21 DIAGNOSIS — M546 Pain in thoracic spine: Secondary | ICD-10-CM | POA: Diagnosis not present

## 2019-07-21 DIAGNOSIS — M9902 Segmental and somatic dysfunction of thoracic region: Secondary | ICD-10-CM | POA: Diagnosis not present

## 2019-07-21 DIAGNOSIS — M9901 Segmental and somatic dysfunction of cervical region: Secondary | ICD-10-CM | POA: Diagnosis not present

## 2019-07-21 DIAGNOSIS — M50322 Other cervical disc degeneration at C5-C6 level: Secondary | ICD-10-CM | POA: Diagnosis not present

## 2019-07-22 ENCOUNTER — Encounter: Payer: Self-pay | Admitting: Cardiology

## 2019-07-22 ENCOUNTER — Other Ambulatory Visit: Payer: Self-pay

## 2019-07-22 ENCOUNTER — Ambulatory Visit (INDEPENDENT_AMBULATORY_CARE_PROVIDER_SITE_OTHER): Payer: BC Managed Care – PPO | Admitting: Cardiology

## 2019-07-22 VITALS — BP 106/78 | HR 79 | Temp 98.0°F | Ht 70.0 in | Wt 194.0 lb

## 2019-07-22 DIAGNOSIS — I499 Cardiac arrhythmia, unspecified: Secondary | ICD-10-CM | POA: Diagnosis not present

## 2019-07-22 DIAGNOSIS — I493 Ventricular premature depolarization: Secondary | ICD-10-CM

## 2019-07-22 NOTE — Progress Notes (Signed)
Clinical Summary Mr. Brander is a 59 y.o.male seen today for follow up of the following medical problems.    1.Irregular heart/PVCs  -minimal feelings ofpalpitations. Told in the past he has had some irregular beats, at previous doctor visits and also 2 different times while going to donate blood where he was told his pulse was "irregular". Notes some high heart rates with lower levels of exercisethan usual for him. Told of irregular pulse.   - coffee x 2-3 cups, occasional tea, no sodas, no energy drinks, drinks on weekends.  Jan 2020 holter Infrequent ventricular ectopy primarily as isolated PVCs, bigeminy, trigeminy. One 3 beat run of NSVT Echo normal LVEF, no WMAs   - no recent palpitations since last visit  SH: works Chiropodist   Had covid vaccine x 2  Past Medical History:  Diagnosis Date  . Bradycardia   . Depression   . Dysrhythmia    echocardiogram 04/17/2018  . Insomnia   . Memory loss   . Polyp of colon      No Known Allergies   Current Outpatient Medications  Medication Sig Dispense Refill  . escitalopram (LEXAPRO) 20 MG tablet Take 20 mg by mouth every other day. In the morning.    [name] GLUCOSAMINE-CHONDROITIN DS PO Take 1 tablet by mouth daily.    . Multiple Vitamin (MULTIVITAMIN WITH MINERALS) TABS tablet Take 1 tablet by mouth daily.    . temazepam (RESTORIL) 30 MG capsule Take 30 mg by mouth every other day. At bedtime.     No current facility-administered medications for this visit.     Past Surgical History:  Procedure Laterality Date  . bilateral knee surgery Bilateral    bilateral acl repair  . COLONOSCOPY N/A 01/17/2013   Procedure: COLONOSCOPY;  Surgeon: Daneil Dolin, MD;  Location: [facility];  Service: Endoscopy;  Laterality: N/A;  8:15 AM  . COLONOSCOPY N/A 04/19/2018   Procedure: COLONOSCOPY;  Surgeon: Daneil Dolin, MD;  Location: [facility];  Service: Endoscopy;  Laterality: N/A;  8:30  .  HERNIA REPAIR Left    inguinal  . POLYPECTOMY  04/19/2018   Procedure: POLYPECTOMY;  Surgeon: Daneil Dolin, MD;  Location: [facility];  Service: Endoscopy;;  colon     No Known Allergies    Family History  Problem Relation Age of Onset  . Dementia Mother   . Lung cancer Father   . Diabetes Father      Social History Mr. Koda reports that he has quit smoking. His smoking use included cigarettes. He has never used smokeless tobacco. Mr. Hlavka reports current alcohol use.   Review of Systems CONSTITUTIONAL: No weight loss, fever, chills, weakness or fatigue.  HEENT: Eyes: No visual loss, blurred vision, double vision or yellow sclerae.No hearing loss, sneezing, congestion, runny nose or sore throat.  SKIN: No rash or itching.  CARDIOVASCULAR: per hpi RESPIRATORY: No shortness of breath, cough or sputum.  GASTROINTESTINAL: No anorexia, nausea, vomiting or diarrhea. No abdominal pain or blood.  GENITOURINARY: No burning on urination, no polyuria NEUROLOGICAL: No headache, dizziness, syncope, paralysis, ataxia, numbness or tingling in the extremities. No change in bowel or bladder control.  MUSCULOSKELETAL: No muscle, back pain, joint pain or stiffness.  LYMPHATICS: No enlarged nodes. No history of splenectomy.  PSYCHIATRIC: No history of depression or anxiety.  ENDOCRINOLOGIC: No reports of sweating, cold or heat intolerance. No polyuria or polydipsia.  [name]   Physical Examination Today's Vitals  07/22/19 1534  BP: 106/78  Pulse: 79  Temp: 98 F (36.7 C)  SpO2: 98%  Weight: 194 lb (88 kg)  Height: 5\' 10"  (1.778 m)   Body mass index is 27.84 kg/m.  Gen: resting comfortably, no acute distress HEENT: no scleral icterus, pupils equal round and reactive, no palptable cervical adenopathy,  CV: RRR, no m/r/g, no jvd Resp: Clear to auscultation bilaterally GI: abdomen is soft, non-tender, non-distended, normal bowel sounds, no hepatosplenomegaly MSK: extremities are  warm, no edema.  Skin: warm, no rash Neuro:  no focal deficits Psych: appropriate affect   Diagnostic Studies Jan 2020 48 hr holter  48 hour monitor  Min HR 53, Max HR 149, Avg HR 80  Infrequent ventricular ectopy primarily as isolated PVCs, bigeminy, trigeminy. One 3 beat run of NSVT  Rare supraventricular ectopy all in the form of isolated PACs  No symptoms reported   04/2018 echo IMPRESSIONS    1. The left ventricle has normal systolic function of 0000000. The cavity  size is normal. There is no increased left ventricular wall thickness.  Echo evidence of normal diastolic relaxation.  2. The right ventricle has normal systolic function. The cavity in normal  in size. There is no increase in right ventricular wall thickness.  3. The mitral valve is normal in structure There is mild thickening and  mild calcification.  4. The aortic valve is normal in structure and function.  5. The pulmonic valve is grossly normal.  6. The aortic root is normal in size and structure.    Assessment and Plan  1. Irregular heart beat/PVCs - monitor with occasional PVCs, echo without underlysing structural heart disease.  - no symptoms, continue to monitor at this time.       Arnoldo Lenis, M.D.

## 2019-07-22 NOTE — Patient Instructions (Signed)
Medication Instructions: Your physician recommends that you continue on your current medications as directed. Please refer to the Current Medication list given to you today.   Labwork: None today  Procedures/Testing: None today  Follow-Up:  1 year Office visit with Dr.Branch  Any Additional Special Instructions Will Be Listed Below (If Applicable).     If you need a refill on your cardiac medications before your next appointment, please call your pharmacy.     Thank you for choosing [facility] !

## 2019-07-23 DIAGNOSIS — M546 Pain in thoracic spine: Secondary | ICD-10-CM | POA: Diagnosis not present

## 2019-07-23 DIAGNOSIS — M50322 Other cervical disc degeneration at C5-C6 level: Secondary | ICD-10-CM | POA: Diagnosis not present

## 2019-07-23 DIAGNOSIS — M9901 Segmental and somatic dysfunction of cervical region: Secondary | ICD-10-CM | POA: Diagnosis not present

## 2019-07-23 DIAGNOSIS — M9902 Segmental and somatic dysfunction of thoracic region: Secondary | ICD-10-CM | POA: Diagnosis not present

## 2019-07-24 DIAGNOSIS — M50322 Other cervical disc degeneration at C5-C6 level: Secondary | ICD-10-CM | POA: Diagnosis not present

## 2019-07-24 DIAGNOSIS — M9902 Segmental and somatic dysfunction of thoracic region: Secondary | ICD-10-CM | POA: Diagnosis not present

## 2019-07-24 DIAGNOSIS — M546 Pain in thoracic spine: Secondary | ICD-10-CM | POA: Diagnosis not present

## 2019-07-24 DIAGNOSIS — M9901 Segmental and somatic dysfunction of cervical region: Secondary | ICD-10-CM | POA: Diagnosis not present

## 2019-07-28 DIAGNOSIS — M50322 Other cervical disc degeneration at C5-C6 level: Secondary | ICD-10-CM | POA: Diagnosis not present

## 2019-07-28 DIAGNOSIS — M9902 Segmental and somatic dysfunction of thoracic region: Secondary | ICD-10-CM | POA: Diagnosis not present

## 2019-07-28 DIAGNOSIS — M546 Pain in thoracic spine: Secondary | ICD-10-CM | POA: Diagnosis not present

## 2019-07-28 DIAGNOSIS — M9901 Segmental and somatic dysfunction of cervical region: Secondary | ICD-10-CM | POA: Diagnosis not present

## 2019-07-29 DIAGNOSIS — M50322 Other cervical disc degeneration at C5-C6 level: Secondary | ICD-10-CM | POA: Diagnosis not present

## 2019-07-29 DIAGNOSIS — M9901 Segmental and somatic dysfunction of cervical region: Secondary | ICD-10-CM | POA: Diagnosis not present

## 2019-07-29 DIAGNOSIS — M9902 Segmental and somatic dysfunction of thoracic region: Secondary | ICD-10-CM | POA: Diagnosis not present

## 2019-07-29 DIAGNOSIS — M546 Pain in thoracic spine: Secondary | ICD-10-CM | POA: Diagnosis not present

## 2019-08-04 DIAGNOSIS — M9902 Segmental and somatic dysfunction of thoracic region: Secondary | ICD-10-CM | POA: Diagnosis not present

## 2019-08-04 DIAGNOSIS — M50322 Other cervical disc degeneration at C5-C6 level: Secondary | ICD-10-CM | POA: Diagnosis not present

## 2019-08-04 DIAGNOSIS — M546 Pain in thoracic spine: Secondary | ICD-10-CM | POA: Diagnosis not present

## 2019-08-04 DIAGNOSIS — M9901 Segmental and somatic dysfunction of cervical region: Secondary | ICD-10-CM | POA: Diagnosis not present

## 2019-08-06 DIAGNOSIS — M9901 Segmental and somatic dysfunction of cervical region: Secondary | ICD-10-CM | POA: Diagnosis not present

## 2019-08-06 DIAGNOSIS — M9902 Segmental and somatic dysfunction of thoracic region: Secondary | ICD-10-CM | POA: Diagnosis not present

## 2019-08-06 DIAGNOSIS — M50322 Other cervical disc degeneration at C5-C6 level: Secondary | ICD-10-CM | POA: Diagnosis not present

## 2019-08-06 DIAGNOSIS — M546 Pain in thoracic spine: Secondary | ICD-10-CM | POA: Diagnosis not present

## 2019-08-13 DIAGNOSIS — M50322 Other cervical disc degeneration at C5-C6 level: Secondary | ICD-10-CM | POA: Diagnosis not present

## 2019-08-13 DIAGNOSIS — M546 Pain in thoracic spine: Secondary | ICD-10-CM | POA: Diagnosis not present

## 2019-08-13 DIAGNOSIS — M9901 Segmental and somatic dysfunction of cervical region: Secondary | ICD-10-CM | POA: Diagnosis not present

## 2019-08-13 DIAGNOSIS — M9902 Segmental and somatic dysfunction of thoracic region: Secondary | ICD-10-CM | POA: Diagnosis not present

## 2019-08-14 DIAGNOSIS — M9901 Segmental and somatic dysfunction of cervical region: Secondary | ICD-10-CM | POA: Diagnosis not present

## 2019-08-14 DIAGNOSIS — M546 Pain in thoracic spine: Secondary | ICD-10-CM | POA: Diagnosis not present

## 2019-08-14 DIAGNOSIS — M50322 Other cervical disc degeneration at C5-C6 level: Secondary | ICD-10-CM | POA: Diagnosis not present

## 2019-08-14 DIAGNOSIS — M9902 Segmental and somatic dysfunction of thoracic region: Secondary | ICD-10-CM | POA: Diagnosis not present

## 2019-08-18 DIAGNOSIS — M9901 Segmental and somatic dysfunction of cervical region: Secondary | ICD-10-CM | POA: Diagnosis not present

## 2019-08-18 DIAGNOSIS — M546 Pain in thoracic spine: Secondary | ICD-10-CM | POA: Diagnosis not present

## 2019-08-18 DIAGNOSIS — M50322 Other cervical disc degeneration at C5-C6 level: Secondary | ICD-10-CM | POA: Diagnosis not present

## 2019-08-18 DIAGNOSIS — M9902 Segmental and somatic dysfunction of thoracic region: Secondary | ICD-10-CM | POA: Diagnosis not present

## 2019-08-20 DIAGNOSIS — M9901 Segmental and somatic dysfunction of cervical region: Secondary | ICD-10-CM | POA: Diagnosis not present

## 2019-08-20 DIAGNOSIS — M546 Pain in thoracic spine: Secondary | ICD-10-CM | POA: Diagnosis not present

## 2019-08-20 DIAGNOSIS — M50322 Other cervical disc degeneration at C5-C6 level: Secondary | ICD-10-CM | POA: Diagnosis not present

## 2019-08-20 DIAGNOSIS — M9902 Segmental and somatic dysfunction of thoracic region: Secondary | ICD-10-CM | POA: Diagnosis not present

## 2019-08-25 DIAGNOSIS — Z0001 Encounter for general adult medical examination with abnormal findings: Secondary | ICD-10-CM | POA: Diagnosis not present

## 2019-08-25 DIAGNOSIS — R7309 Other abnormal glucose: Secondary | ICD-10-CM | POA: Diagnosis not present

## 2019-08-25 DIAGNOSIS — G4700 Insomnia, unspecified: Secondary | ICD-10-CM | POA: Diagnosis not present

## 2019-08-25 DIAGNOSIS — M9901 Segmental and somatic dysfunction of cervical region: Secondary | ICD-10-CM | POA: Diagnosis not present

## 2019-08-25 DIAGNOSIS — R001 Bradycardia, unspecified: Secondary | ICD-10-CM | POA: Diagnosis not present

## 2019-08-25 DIAGNOSIS — Z23 Encounter for immunization: Secondary | ICD-10-CM | POA: Diagnosis not present

## 2019-08-25 DIAGNOSIS — M50322 Other cervical disc degeneration at C5-C6 level: Secondary | ICD-10-CM | POA: Diagnosis not present

## 2019-08-25 DIAGNOSIS — M9902 Segmental and somatic dysfunction of thoracic region: Secondary | ICD-10-CM | POA: Diagnosis not present

## 2019-08-25 DIAGNOSIS — Z6827 Body mass index (BMI) 27.0-27.9, adult: Secondary | ICD-10-CM | POA: Diagnosis not present

## 2019-08-25 DIAGNOSIS — Z1389 Encounter for screening for other disorder: Secondary | ICD-10-CM | POA: Diagnosis not present

## 2019-08-25 DIAGNOSIS — M546 Pain in thoracic spine: Secondary | ICD-10-CM | POA: Diagnosis not present

## 2019-08-27 DIAGNOSIS — M9901 Segmental and somatic dysfunction of cervical region: Secondary | ICD-10-CM | POA: Diagnosis not present

## 2019-08-27 DIAGNOSIS — M9902 Segmental and somatic dysfunction of thoracic region: Secondary | ICD-10-CM | POA: Diagnosis not present

## 2019-08-27 DIAGNOSIS — M50322 Other cervical disc degeneration at C5-C6 level: Secondary | ICD-10-CM | POA: Diagnosis not present

## 2019-08-27 DIAGNOSIS — M546 Pain in thoracic spine: Secondary | ICD-10-CM | POA: Diagnosis not present

## 2019-09-04 DIAGNOSIS — M546 Pain in thoracic spine: Secondary | ICD-10-CM | POA: Diagnosis not present

## 2019-09-04 DIAGNOSIS — M50322 Other cervical disc degeneration at C5-C6 level: Secondary | ICD-10-CM | POA: Diagnosis not present

## 2019-09-04 DIAGNOSIS — M9901 Segmental and somatic dysfunction of cervical region: Secondary | ICD-10-CM | POA: Diagnosis not present

## 2019-09-04 DIAGNOSIS — M9902 Segmental and somatic dysfunction of thoracic region: Secondary | ICD-10-CM | POA: Diagnosis not present

## 2019-09-09 DIAGNOSIS — M546 Pain in thoracic spine: Secondary | ICD-10-CM | POA: Diagnosis not present

## 2019-09-09 DIAGNOSIS — M9902 Segmental and somatic dysfunction of thoracic region: Secondary | ICD-10-CM | POA: Diagnosis not present

## 2019-09-09 DIAGNOSIS — M50322 Other cervical disc degeneration at C5-C6 level: Secondary | ICD-10-CM | POA: Diagnosis not present

## 2019-09-09 DIAGNOSIS — M9901 Segmental and somatic dysfunction of cervical region: Secondary | ICD-10-CM | POA: Diagnosis not present

## 2019-09-18 DIAGNOSIS — M9902 Segmental and somatic dysfunction of thoracic region: Secondary | ICD-10-CM | POA: Diagnosis not present

## 2019-09-18 DIAGNOSIS — M9903 Segmental and somatic dysfunction of lumbar region: Secondary | ICD-10-CM | POA: Diagnosis not present

## 2019-09-18 DIAGNOSIS — M9901 Segmental and somatic dysfunction of cervical region: Secondary | ICD-10-CM | POA: Diagnosis not present

## 2019-09-18 DIAGNOSIS — M5137 Other intervertebral disc degeneration, lumbosacral region: Secondary | ICD-10-CM | POA: Diagnosis not present

## 2019-09-24 DIAGNOSIS — L821 Other seborrheic keratosis: Secondary | ICD-10-CM | POA: Diagnosis not present

## 2019-09-24 DIAGNOSIS — D225 Melanocytic nevi of trunk: Secondary | ICD-10-CM | POA: Diagnosis not present

## 2019-09-24 DIAGNOSIS — D1801 Hemangioma of skin and subcutaneous tissue: Secondary | ICD-10-CM | POA: Diagnosis not present

## 2019-09-24 DIAGNOSIS — L820 Inflamed seborrheic keratosis: Secondary | ICD-10-CM | POA: Diagnosis not present

## 2020-02-04 DIAGNOSIS — M1711 Unilateral primary osteoarthritis, right knee: Secondary | ICD-10-CM | POA: Diagnosis not present

## 2020-03-21 DIAGNOSIS — Z1152 Encounter for screening for COVID-19: Secondary | ICD-10-CM | POA: Diagnosis not present

## 2020-04-09 DIAGNOSIS — Z23 Encounter for immunization: Secondary | ICD-10-CM | POA: Diagnosis not present

## 2020-06-30 DIAGNOSIS — G4700 Insomnia, unspecified: Secondary | ICD-10-CM | POA: Diagnosis not present

## 2020-07-27 DIAGNOSIS — D225 Melanocytic nevi of trunk: Secondary | ICD-10-CM | POA: Diagnosis not present

## 2020-07-27 DIAGNOSIS — L821 Other seborrheic keratosis: Secondary | ICD-10-CM | POA: Diagnosis not present

## 2020-07-27 DIAGNOSIS — L300 Nummular dermatitis: Secondary | ICD-10-CM | POA: Diagnosis not present

## 2020-07-27 DIAGNOSIS — D1801 Hemangioma of skin and subcutaneous tissue: Secondary | ICD-10-CM | POA: Diagnosis not present

## 2020-08-30 DIAGNOSIS — Z6827 Body mass index (BMI) 27.0-27.9, adult: Secondary | ICD-10-CM | POA: Diagnosis not present

## 2020-08-30 DIAGNOSIS — Z1389 Encounter for screening for other disorder: Secondary | ICD-10-CM | POA: Diagnosis not present

## 2020-08-30 DIAGNOSIS — E663 Overweight: Secondary | ICD-10-CM | POA: Diagnosis not present

## 2020-08-30 DIAGNOSIS — R001 Bradycardia, unspecified: Secondary | ICD-10-CM | POA: Diagnosis not present

## 2020-08-30 DIAGNOSIS — Z13220 Encounter for screening for lipoid disorders: Secondary | ICD-10-CM | POA: Diagnosis not present

## 2020-08-30 DIAGNOSIS — Z0001 Encounter for general adult medical examination with abnormal findings: Secondary | ICD-10-CM | POA: Diagnosis not present

## 2020-08-30 DIAGNOSIS — K635 Polyp of colon: Secondary | ICD-10-CM | POA: Diagnosis not present

## 2020-08-30 DIAGNOSIS — R7309 Other abnormal glucose: Secondary | ICD-10-CM | POA: Diagnosis not present

## 2020-08-30 DIAGNOSIS — Z1331 Encounter for screening for depression: Secondary | ICD-10-CM | POA: Diagnosis not present

## 2020-08-30 DIAGNOSIS — F329 Major depressive disorder, single episode, unspecified: Secondary | ICD-10-CM | POA: Diagnosis not present

## 2020-09-07 ENCOUNTER — Ambulatory Visit: Payer: BC Managed Care – PPO | Admitting: Cardiology

## 2020-10-21 ENCOUNTER — Ambulatory Visit: Payer: BC Managed Care – PPO | Admitting: Cardiology

## 2020-11-16 DIAGNOSIS — B070 Plantar wart: Secondary | ICD-10-CM | POA: Diagnosis not present

## 2020-12-14 ENCOUNTER — Encounter: Payer: Self-pay | Admitting: Cardiology

## 2020-12-14 ENCOUNTER — Ambulatory Visit (INDEPENDENT_AMBULATORY_CARE_PROVIDER_SITE_OTHER): Payer: BC Managed Care – PPO | Admitting: Cardiology

## 2020-12-14 VITALS — BP 130/82 | HR 52 | Ht 70.0 in | Wt 194.5 lb

## 2020-12-14 DIAGNOSIS — R001 Bradycardia, unspecified: Secondary | ICD-10-CM

## 2020-12-14 DIAGNOSIS — I499 Cardiac arrhythmia, unspecified: Secondary | ICD-10-CM | POA: Diagnosis not present

## 2020-12-14 DIAGNOSIS — I493 Ventricular premature depolarization: Secondary | ICD-10-CM | POA: Diagnosis not present

## 2020-12-14 NOTE — Progress Notes (Signed)
Clinical Summary Alex Weaver is a 60 y.o.male seen today for follow up of the following medical problems.      1.Irregular heart/PVCs/Sinus bradycardia   - minimal feelings of palpitations. Told in the past he has had some irregular beats, at previous doctor visits and also 2 different times while going to donate blood where he was told his pulse was "irregular". Notes some high heart rates with lower levels of exercise than usual for him. Told of irregular pulse.    - coffee x 2-3 cups, occasional tea, no sodas, no energy drinks, drinks on weekends.   Jan 2020 holter Infrequent ventricular ectopy primarily as isolated PVCs, bigeminy, trigeminy. One 3 beat run of NSVT Echo normal LVEF, no WMAs      - denies any recent palpitations.  -cyclins once a week   SH: works Chiropodist     Past Medical History:  Diagnosis Date   Bradycardia    Depression    Dysrhythmia    echocardiogram 04/17/2018   Insomnia    Memory loss    Polyp of colon      No Known Allergies   Current Outpatient Medications  Medication Sig Dispense Refill   escitalopram (LEXAPRO) 20 MG tablet Take 20 mg by mouth every other day. In the morning.     GLUCOSAMINE-CHONDROITIN DS PO Take 1 tablet by mouth daily.     Multiple Vitamin (MULTIVITAMIN WITH MINERALS) TABS tablet Take 1 tablet by mouth daily.     temazepam (RESTORIL) 30 MG capsule Take 30 mg by mouth every other day. At bedtime.     No current facility-administered medications for this visit.     Past Surgical History:  Procedure Laterality Date   bilateral knee surgery Bilateral 01/16/2019   bilateral acl repair   COLONOSCOPY N/A 01/17/2013   Procedure: COLONOSCOPY;  Surgeon: Daneil Dolin, MD;  [facility]: [facility];  Service: Endoscopy;  Laterality: N/A;  8:15 AM   COLONOSCOPY N/A 04/19/2018   Procedure: COLONOSCOPY;  Surgeon: Daneil Dolin, MD;  [facility]: [facility];  Service: Endoscopy;  Laterality: N/A;   8:30   HERNIA REPAIR Left    inguinal   POLYPECTOMY  04/19/2018   Procedure: POLYPECTOMY;  Surgeon: Daneil Dolin, MD;  Location: [facility];  Service: Endoscopy;;  colon     No Known Allergies    Family History  Problem Relation Age of Onset   Dementia Mother    Lung cancer Father    Diabetes Father    Migraines Sister      Social History Mr. Prime reports that he has quit smoking. His smoking use included cigarettes. He has never used smokeless tobacco. Mr. Cabello reports current alcohol use.   Review of Systems CONSTITUTIONAL: No weight loss, fever, chills, weakness or fatigue.  HEENT: Eyes: No visual loss, blurred vision, double vision or yellow sclerae.No hearing loss, sneezing, congestion, runny nose or sore throat.  SKIN: No rash or itching.  CARDIOVASCULAR: per hpi RESPIRATORY: No shortness of breath, cough or sputum.  GASTROINTESTINAL: No anorexia, nausea, vomiting or diarrhea. No abdominal pain or blood.  GENITOURINARY: No burning on urination, no polyuria NEUROLOGICAL: No headache, dizziness, syncope, paralysis, ataxia, numbness or tingling in the extremities. No change in bowel or bladder control.  MUSCULOSKELETAL: No muscle, back pain, joint pain or stiffness.  LYMPHATICS: No enlarged nodes. No history of splenectomy.  PSYCHIATRIC: No history of depression or anxiety.  ENDOCRINOLOGIC: No reports of sweating,  cold or heat intolerance. No polyuria or polydipsia.  [name]   Physical Examination Today's Vitals   12/14/20 1144  BP: 130/82  Pulse: (!) 52  SpO2: 98%  Weight: 194 lb 8 oz (88.2 kg)  Height: 5\' 10"  (1.778 m)   Body mass index is 27.91 kg/m.  Gen: resting comfortably, no acute distress HEENT: no scleral icterus, pupils equal round and reactive, no palptable cervical adenopathy,  CV: regular, brady, no m/r/g no jvd Resp: Clear to auscultation bilaterally GI: abdomen is soft, non-tender, non-distended, normal bowel sounds, no  hepatosplenomegaly MSK: extremities are warm, no edema.  Skin: warm, no rash Neuro:  no focal deficits Psych: appropriate affect   Diagnostic Studies  Jan 2020 48 hr holter 48 hour monitor Min HR 53, Max HR 149, Avg HR 80 Infrequent ventricular ectopy primarily as isolated PVCs, bigeminy, trigeminy. One 3 beat run of NSVT Rare supraventricular ectopy all in the form of isolated PACs No symptoms reported     04/2018 echo IMPRESSIONS     1. The left ventricle has normal systolic function of 16-10%. The cavity  size is normal. There is no increased left ventricular wall thickness.  Echo evidence of normal diastolic relaxation.   2. The right ventricle has normal systolic function. The cavity in normal  in size. There is no increase in right ventricular wall thickness.   3. The mitral valve is normal in structure There is mild thickening and  mild calcification.   4. The aortic valve is normal in structure and function.   5. The pulmonic valve is grossly normal.   6. The aortic root is normal in size and structure.    Assessment and Plan  1. Irregular heart beat/PVCs - monitor with occasional PVCs, echo without underlysing structural heart disease.  - remains asymptomatic, monitor at this time  2. Bradycardia -EKG today with  sinus brady 52 today with first degree heart block - no symptoms. Remaisn very physically actrive, lifting weights and cycling. - no further testing indicated at this time, will monitor   F/u 1 year    Arnoldo Lenis, M.D.

## 2020-12-14 NOTE — Patient Instructions (Signed)

## 2020-12-16 ENCOUNTER — Encounter: Payer: Self-pay | Admitting: *Deleted

## 2021-08-01 DIAGNOSIS — L821 Other seborrheic keratosis: Secondary | ICD-10-CM | POA: Diagnosis not present

## 2021-08-01 DIAGNOSIS — D225 Melanocytic nevi of trunk: Secondary | ICD-10-CM | POA: Diagnosis not present

## 2021-08-01 DIAGNOSIS — D2372 Other benign neoplasm of skin of left lower limb, including hip: Secondary | ICD-10-CM | POA: Diagnosis not present

## 2021-08-01 DIAGNOSIS — L308 Other specified dermatitis: Secondary | ICD-10-CM | POA: Diagnosis not present

## 2021-09-01 DIAGNOSIS — G4700 Insomnia, unspecified: Secondary | ICD-10-CM | POA: Diagnosis not present

## 2021-09-01 DIAGNOSIS — Z1389 Encounter for screening for other disorder: Secondary | ICD-10-CM | POA: Diagnosis not present

## 2021-09-01 DIAGNOSIS — K635 Polyp of colon: Secondary | ICD-10-CM | POA: Diagnosis not present

## 2021-09-01 DIAGNOSIS — Z0000 Encounter for general adult medical examination without abnormal findings: Secondary | ICD-10-CM | POA: Diagnosis not present

## 2021-09-01 DIAGNOSIS — E782 Mixed hyperlipidemia: Secondary | ICD-10-CM | POA: Diagnosis not present

## 2021-09-01 DIAGNOSIS — R7309 Other abnormal glucose: Secondary | ICD-10-CM | POA: Diagnosis not present

## 2021-09-01 DIAGNOSIS — E663 Overweight: Secondary | ICD-10-CM | POA: Diagnosis not present

## 2021-09-01 DIAGNOSIS — Z6827 Body mass index (BMI) 27.0-27.9, adult: Secondary | ICD-10-CM | POA: Diagnosis not present

## 2022-02-20 ENCOUNTER — Encounter: Payer: Self-pay | Admitting: Cardiology

## 2022-02-20 ENCOUNTER — Ambulatory Visit: Payer: BC Managed Care – PPO | Attending: Cardiology | Admitting: Cardiology

## 2022-02-20 VITALS — BP 132/82 | HR 61 | Ht 70.0 in | Wt 195.0 lb

## 2022-02-20 DIAGNOSIS — R001 Bradycardia, unspecified: Secondary | ICD-10-CM

## 2022-02-20 DIAGNOSIS — I499 Cardiac arrhythmia, unspecified: Secondary | ICD-10-CM

## 2022-02-20 DIAGNOSIS — I493 Ventricular premature depolarization: Secondary | ICD-10-CM | POA: Diagnosis not present

## 2022-02-20 NOTE — Patient Instructions (Signed)
Medication Instructions:  Your physician recommends that you continue on your current medications as directed. Please refer to the Current Medication list given to you today.   Labwork: None  Testing/Procedures: None  Follow-Up: Follow up with Dr. Harl Bowie as needed.   Any Other Special Instructions Will Be Listed Below (If Applicable).     If you need a refill on your cardiac medications before your next appointment, please call your pharmacy.

## 2022-02-20 NOTE — Progress Notes (Signed)
Clinical Summary Alex Weaver is a 61 y.o.male seen today for follow up of the following medical problems.      1.Irregular heart/PVCs/Sinus bradycardia   - minimal feelings of palpitations. Told in the past he has had some irregular beats, at previous doctor visits and also 2 different times while going to donate blood where he was told his pulse was "irregular". Notes some high heart rates with lower levels of exercise than usual for him. Told of irregular pulse.    - coffee x 2-3 cups, occasional tea, no sodas, no energy drinks, drinks on weekends.   Jan 2020 holter Infrequent ventricular ectopy primarily as isolated PVCs, bigeminy, trigeminy. One 3 beat run of NSVT Echo normal LVEF, no WMAs       - no recent palpitations     2. Bradycardia - cycle 15-20 miles up to 1-2 times per week, kayaking regularly. Heavy cardio/weightlifting - no symptoms   SH: works Chiropodist    Past Medical History:  Diagnosis Date   Bradycardia    Depression    Dysrhythmia    echocardiogram 04/17/2018   Insomnia    Memory loss    Polyp of colon      No Known Allergies   Current Outpatient Medications  Medication Sig Dispense Refill   escitalopram (LEXAPRO) 20 MG tablet Take 20 mg by mouth every other day. In the morning. (Patient not taking: Reported on 12/14/2020)     GLUCOSAMINE-CHONDROITIN DS PO Take 1 tablet by mouth daily. (Patient not taking: Reported on 12/14/2020)     Multiple Vitamin (MULTIVITAMIN WITH MINERALS) TABS tablet Take 1 tablet by mouth daily.     temazepam (RESTORIL) 30 MG capsule Take 30 mg by mouth every other day. At bedtime.     No current facility-administered medications for this visit.     Past Surgical History:  Procedure Laterality Date   bilateral knee surgery Bilateral 01/16/2019   bilateral acl repair   COLONOSCOPY N/A 01/17/2013   Procedure: COLONOSCOPY;  Surgeon: Daneil Dolin, MD;  [facility]: [facility];  Service:  Endoscopy;  Laterality: N/A;  8:15 AM   COLONOSCOPY N/A 04/19/2018   Procedure: COLONOSCOPY;  Surgeon: Daneil Dolin, MD;  [facility]: [facility];  Service: Endoscopy;  Laterality: N/A;  8:30   HERNIA REPAIR Left    inguinal   POLYPECTOMY  04/19/2018   Procedure: POLYPECTOMY;  Surgeon: Daneil Dolin, MD;  Location: [facility];  Service: Endoscopy;;  colon     No Known Allergies    Family History  Problem Relation Age of Onset   Dementia Mother    Lung cancer Father    Diabetes Father    Migraines Sister      Social History Alex Weaver reports that he has quit smoking. His smoking use included cigarettes. He has never used smokeless tobacco. Alex Weaver reports current alcohol use.   Review of Systems CONSTITUTIONAL: No weight loss, fever, chills, weakness or fatigue.  HEENT: Eyes: No visual loss, blurred vision, double vision or yellow sclerae.No hearing loss, sneezing, congestion, runny nose or sore throat.  SKIN: No rash or itching.  CARDIOVASCULAR: per hpi RESPIRATORY: No shortness of breath, cough or sputum.  GASTROINTESTINAL: No anorexia, nausea, vomiting or diarrhea. No abdominal pain or blood.  GENITOURINARY: No burning on urination, no polyuria NEUROLOGICAL: No headache, dizziness, syncope, paralysis, ataxia, numbness or tingling in the extremities. No change in bowel or bladder control.  MUSCULOSKELETAL: No  muscle, back pain, joint pain or stiffness.  LYMPHATICS: No enlarged nodes. No history of splenectomy.  PSYCHIATRIC: No history of depression or anxiety.  ENDOCRINOLOGIC: No reports of sweating, cold or heat intolerance. No polyuria or polydipsia.  [name]   Physical Examination Today's Vitals   02/20/22 1602  BP: 132/82  Pulse: 61  SpO2: 100%  Weight: 195 lb (88.5 kg)  Height: '5\' 10"'$  (1.778 m)   Body mass index is 27.98 kg/m.  Gen: resting comfortably, no acute distress HEENT: no scleral icterus, pupils equal round and reactive, no palptable cervical  adenopathy,  CV: RRR, no m/rg, no jvd Resp: Clear to auscultation bilaterally GI: abdomen is soft, non-tender, non-distended, normal bowel sounds, no hepatosplenomegaly MSK: extremities are warm, no edema.  Skin: warm, no rash Neuro:  no focal deficits Psych: appropriate affect   Diagnostic Studies  Jan 2020 48 hr holter 48 hour monitor Min HR 53, Max HR 149, Avg HR 80 Infrequent ventricular ectopy primarily as isolated PVCs, bigeminy, trigeminy. One 3 beat run of NSVT Rare supraventricular ectopy all in the form of isolated PACs No symptoms reported     04/2018 echo IMPRESSIONS     1. The left ventricle has normal systolic function of 63-84%. The cavity  size is normal. There is no increased left ventricular wall thickness.  Echo evidence of normal diastolic relaxation.   2. The right ventricle has normal systolic function. The cavity in normal  in size. There is no increase in right ventricular wall thickness.   3. The mitral valve is normal in structure There is mild thickening and  mild calcification.   4. The aortic valve is normal in structure and function.   5. The pulmonic valve is grossly normal.   6. The aortic root is normal in size and structure.      Assessment and Plan   1. Irregular heart beat/PVCs - monitor with occasional PVCs, echo without underlysing structural heart disease.  -no symptoms, no further workup is indicated   2. Bradycardia -prior EKG sinus brady 52, monitor with HR 53 to 145, avg 80 - EKG SR at 61 - overall infrequent mild asymptomatic bradycardia, does not require any further f/u. Would likely avoid or be cautious with any consideration for av nodal agents in the future   F/u just as needed.      Arnoldo Lenis, M.D

## 2022-02-20 NOTE — Addendum Note (Signed)
Addended by: Levonne Hubert on: 02/20/2022 05:01 PM   Modules accepted: Orders

## 2022-06-15 DIAGNOSIS — G4700 Insomnia, unspecified: Secondary | ICD-10-CM | POA: Diagnosis not present

## 2022-09-04 DIAGNOSIS — Z6827 Body mass index (BMI) 27.0-27.9, adult: Secondary | ICD-10-CM | POA: Diagnosis not present

## 2022-09-04 DIAGNOSIS — G4700 Insomnia, unspecified: Secondary | ICD-10-CM | POA: Diagnosis not present

## 2022-09-04 DIAGNOSIS — E782 Mixed hyperlipidemia: Secondary | ICD-10-CM | POA: Diagnosis not present

## 2022-09-04 DIAGNOSIS — Z1331 Encounter for screening for depression: Secondary | ICD-10-CM | POA: Diagnosis not present

## 2022-09-04 DIAGNOSIS — D225 Melanocytic nevi of trunk: Secondary | ICD-10-CM | POA: Diagnosis not present

## 2022-09-04 DIAGNOSIS — Z0000 Encounter for general adult medical examination without abnormal findings: Secondary | ICD-10-CM | POA: Diagnosis not present

## 2022-09-04 DIAGNOSIS — L853 Xerosis cutis: Secondary | ICD-10-CM | POA: Diagnosis not present

## 2022-09-04 DIAGNOSIS — L812 Freckles: Secondary | ICD-10-CM | POA: Diagnosis not present

## 2022-09-04 DIAGNOSIS — R7309 Other abnormal glucose: Secondary | ICD-10-CM | POA: Diagnosis not present

## 2022-09-04 DIAGNOSIS — K635 Polyp of colon: Secondary | ICD-10-CM | POA: Diagnosis not present

## 2022-09-04 DIAGNOSIS — E663 Overweight: Secondary | ICD-10-CM | POA: Diagnosis not present

## 2022-09-04 DIAGNOSIS — L821 Other seborrheic keratosis: Secondary | ICD-10-CM | POA: Diagnosis not present

## 2023-02-28 DIAGNOSIS — A493 Mycoplasma infection, unspecified site: Secondary | ICD-10-CM | POA: Diagnosis not present

## 2023-02-28 DIAGNOSIS — Z6827 Body mass index (BMI) 27.0-27.9, adult: Secondary | ICD-10-CM | POA: Diagnosis not present

## 2023-02-28 DIAGNOSIS — Z20828 Contact with and (suspected) exposure to other viral communicable diseases: Secondary | ICD-10-CM | POA: Diagnosis not present

## 2023-02-28 DIAGNOSIS — R6889 Other general symptoms and signs: Secondary | ICD-10-CM | POA: Diagnosis not present

## 2023-02-28 DIAGNOSIS — E663 Overweight: Secondary | ICD-10-CM | POA: Diagnosis not present

## 2023-03-22 ENCOUNTER — Encounter: Payer: Self-pay | Admitting: *Deleted

## 2023-04-12 ENCOUNTER — Telehealth: Payer: Self-pay | Admitting: *Deleted

## 2023-04-12 NOTE — Telephone Encounter (Signed)
  Procedure: COLONOSCOPY  Height: 5'10 Weight: 185LBS       Have you had a colonoscopy before?  04/19/18, Dr. Jena Gauss  Do you have family history of colon cancer?  no  Do you have a family history of polyps? no  Previous colonoscopy with polyps removed? yes  Do you have a history colorectal cancer?   no  Are you diabetic?  no  Do you have a prosthetic or mechanical heart valve? no  Do you have a pacemaker/defibrillator?   no  Have you had endocarditis/atrial fibrillation?  no  Do you use supplemental oxygen/CPAP?  no  Have you had joint replacement within the last 12 months?  no  Do you tend to be constipated or have to use laxatives?  no   Do you have history of alcohol use? If yes, how much and how often.  Yes 12 beers per week  Do you have history or are you using drugs? If yes, what do are you  using?  no  Have you ever had a stroke/heart attack?    Have you ever had a heart or other vascular stent placed,?no  Do you take weight loss medication? no  Do you take any blood-thinning medications such as: (Plavix, aspirin, Coumadin, Aggrenox, Brilinta, Xarelto, Eliquis, Pradaxa, Savaysa or Effient)? no  If yes we need the name, milligram, dosage and who is prescribing doctor:               Current Outpatient Medications  Medication Sig Dispense Refill   Omega-3 Fatty Acids (FISH OIL PO) Take by mouth. 2400mg  daily     OVER THE COUNTER MEDICATION Epimedium extract 1500mg  daily Resveratrol 1500mg  daily Melatonin every 3rd day, benadryl every 3rd day     pyridOXINE (VITAMIN B6) 100 MG tablet Take 100 mg by mouth daily.     Turmeric 500 MG TABS Take by mouth daily.     Multiple Vitamin (MULTIVITAMIN WITH MINERALS) TABS tablet Take 1 tablet by mouth daily.     temazepam (RESTORIL) 30 MG capsule Take 30 mg by mouth every other day. At bedtime.     No current facility-administered medications for this visit.    No Known Allergies

## 2023-04-14 ENCOUNTER — Ambulatory Visit
Admission: EM | Admit: 2023-04-14 | Discharge: 2023-04-14 | Disposition: A | Discharge status: Home / Self Care | Payer: BC Managed Care – PPO | Attending: Nurse Practitioner | Admitting: Nurse Practitioner

## 2023-04-14 DIAGNOSIS — J22 Unspecified acute lower respiratory infection: Secondary | ICD-10-CM

## 2023-04-14 DIAGNOSIS — J209 Acute bronchitis, unspecified: Secondary | ICD-10-CM

## 2023-04-14 MED ORDER — METHYLPREDNISOLONE SODIUM SUCC 125 MG IJ SOLR
80.0000 mg | Freq: Once | INTRAMUSCULAR | Status: AC
  Administered 2023-04-14: 80 mg via INTRAMUSCULAR

## 2023-04-14 MED ORDER — PROMETHAZINE-DM 6.25-15 MG/5ML PO SYRP: 5.0000 mL | ORAL_SOLUTION | Freq: Four times a day (QID) | ORAL | 0 refills | Status: AC | PRN

## 2023-04-14 MED ORDER — AZITHROMYCIN 250 MG PO TABS: 250.0000 mg | ORAL_TABLET | Freq: Every day | ORAL | 0 refills | Status: AC

## 2023-04-14 NOTE — Discharge Instructions (Addendum)
You were given an injection of Solu-Medrol 80 mg today. Take medication as prescribed. Increase fluids and allow for plenty of rest. May take over-the-counter Tylenol or ibuprofen as needed for pain, fever, or general discomfort. Recommend using a humidifier in your bedroom at nighttime during sleep and sleeping elevated on pillows while cough symptoms persist. As discussed, if you continue to have a lingering cough.  Generally feeling well, continue over-the-counter medications, fluids, and cough drops.  If you experience symptoms of fever, chills, worsening cough, wheezing, or other concerns, please follow-up in this clinic or with your primary care physician for further evaluation. Follow-up as needed.

## 2023-04-14 NOTE — ED Provider Notes (Signed)
[facility]    CSN: [id number] Arrival date & time: 04/14/23  1308      History   Chief Complaint No chief complaint on file.   HPI Alex Weaver is a 63 y.o. male.   The history is provided by the patient.   Patient with 1 week history of cough, nasal congestion, runny nose, and chest congestion.  Patient denies fever, chills, ear pain, wheezing, difficulty breathing, chest pain, abdominal pain, nausea, vomiting, diarrhea, or rash.  Patient reports he has been taking over-the-counter Mucinex for symptoms.  Reports history of pneumonia at the end of December.  Patient denies any obvious known sick contacts.  Past Medical History:  Diagnosis Date   Bradycardia    Depression    Dysrhythmia    echocardiogram 04/17/2018   Insomnia    Memory loss    Polyp of colon     Patient Active Problem List   Diagnosis Date Noted   Memory loss 08/14/2017    Past Surgical History:  Procedure Laterality Date   bilateral knee surgery Bilateral 01/16/2019   bilateral acl repair   COLONOSCOPY N/A 01/17/2013   Procedure: COLONOSCOPY;  Surgeon: Corbin Ade, MD;  Location: [facility];  Service: Endoscopy;  Laterality: N/A;  8:15 AM   COLONOSCOPY N/A 04/19/2018   Procedure: COLONOSCOPY;  Surgeon: Corbin Ade, MD;  Location: [facility];  Service: Endoscopy;  Laterality: N/A;  8:30   HERNIA REPAIR Left    inguinal   POLYPECTOMY  04/19/2018   Procedure: POLYPECTOMY;  Surgeon: Corbin Ade, MD;  Location: [facility];  Service: Endoscopy;;  colon       Home Medications    Prior to Admission medications   Medication Sig Start Date End Date Taking? Authorizing Provider  Multiple Vitamin (MULTIVITAMIN WITH MINERALS) TABS tablet Take 1 tablet by mouth daily.    [provider]  Omega-3 Fatty Acids (FISH OIL PO) Take by mouth. 2400mg  daily    [provider]  OVER THE COUNTER MEDICATION Epimedium extract 1500mg  daily Resveratrol 1500mg   daily Melatonin every 3rd day, benadryl every 3rd day    [provider]  pyridOXINE (VITAMIN B6) 100 MG tablet Take 100 mg by mouth daily.    [provider]  temazepam (RESTORIL) 30 MG capsule Take 30 mg by mouth every other day. At bedtime.    [provider]  Turmeric 500 MG TABS Take by mouth daily.    [provider]    Family History Family History  Problem Relation Age of Onset   Dementia Mother    Lung cancer Father    Diabetes Father    Migraines Sister     Social History Social History   Tobacco Use   Smoking status: Former    Types: Cigarettes   Smokeless tobacco: Never  Vaping Use   Vaping status: Never Used  Substance Use Topics   Alcohol use: Yes    Comment: occasional    Drug use: Never     Allergies   Patient has no known allergies.   Review of Systems Review of Systems Per HPI  Physical Exam Triage Vital Signs ED Triage Vitals [04/14/23 0850]  Encounter Vitals Group     BP 127/85     Systolic BP Percentile      Diastolic BP Percentile      Pulse Rate 80     Resp 16     Temp 98 F (36.7 C)  Temp Source Oral     SpO2 94 %     Weight      Height      Head Circumference      Peak Flow      Pain Score 5     Pain Loc      Pain Education      Exclude from Growth Chart    No data found.  Updated Vital Signs BP 127/85 (BP Location: Right Arm)   Pulse 80   Temp 98 F (36.7 C) (Oral)   Resp 16   SpO2 94%   Visual Acuity Right Eye Distance:   Left Eye Distance:   Bilateral Distance:    Right Eye Near:   Left Eye Near:    Bilateral Near:     Physical Exam Vitals and nursing note reviewed.  Constitutional:      General: He is not in acute distress.    Appearance: Normal appearance.  HENT:     Head: Normocephalic.     Right Ear: Tympanic membrane, ear canal and external ear normal.     Left Ear: Tympanic membrane, ear canal and external ear normal.     Nose: Nose normal.     Right  Turbinates: Enlarged and swollen.     Left Turbinates: Enlarged and swollen.     Right Sinus: No maxillary sinus tenderness or frontal sinus tenderness.     Left Sinus: No maxillary sinus tenderness or frontal sinus tenderness.     Mouth/Throat:     Lips: Pink.     Mouth: Mucous membranes are moist.     Pharynx: Oropharynx is clear. Uvula midline. Postnasal drip present. No pharyngeal swelling or posterior oropharyngeal erythema.  Eyes:     Extraocular Movements: Extraocular movements intact.     Pupils: Pupils are equal, round, and reactive to light.  Cardiovascular:     Rate and Rhythm: Normal rate and regular rhythm.     Pulses: Normal pulses.     Heart sounds: Normal heart sounds.  Pulmonary:     Effort: Pulmonary effort is normal. No respiratory distress.     Breath sounds: No stridor. Rhonchi (posterior LUL, LLL, clears with cough) present. No wheezing or rales.  Abdominal:     General: Bowel sounds are normal.     Palpations: Abdomen is soft.     Tenderness: There is no abdominal tenderness.  Musculoskeletal:     Cervical back: Normal range of motion.  Lymphadenopathy:     Cervical: No cervical adenopathy.  Skin:    General: Skin is warm and dry.  Neurological:     General: No focal deficit present.     Mental Status: He is alert and oriented to person, place, and time.  Psychiatric:        Mood and Affect: Mood normal.        Behavior: Behavior normal.      UC Treatments / Results  Labs (all labs ordered are listed, but only abnormal results are displayed) Labs Reviewed - No data to display  EKG   Radiology No results found.  Procedures Procedures (including critical care time)  Medications Ordered in UC Medications - No data to display  Initial Impression / Assessment and Plan / UC Course  I have reviewed the triage vital signs and the nursing notes.  Pertinent labs & imaging results that were available during my care of the patient were reviewed by  me and considered in my medical decision making (see  chart for details).  On exam, patient with rhonchi in the left upper and left lower lobes, rhonchi cleared with coughing.  Room air sats at 94%.  Suspect lower respiratory infection, consistent with acute bronchitis.  Solu-Medrol 80 mg IM administered for bronchial inflammation.  Will start patient on azithromycin to cover for bacterial etiology, and Promethazine DM for his cough.  Supportive care recommendations were provided and discussed with the patient to include fluids, rest, continuing over-the-counter Mucinex, and use of a humidifier at nighttime during sleep.  Discussed indications with patient regarding follow-up.  Patient was in agreement with this plan of care and verbalizes understanding.  All questions were answered.  Patient stable for discharge.  Final Clinical Impressions(s) / UC Diagnoses   Final diagnoses:  None   Discharge Instructions   None    ED Prescriptions   None    PDMP not reviewed this encounter.   Abran Cantor, NP 04/14/23 [phone]

## 2023-04-14 NOTE — ED Triage Notes (Signed)
Pt reports he has a cough, chest congestion,  x 1 week

## 2023-04-18 NOTE — Telephone Encounter (Signed)
Ok to schedule. ASA 2.  

## 2023-04-19 ENCOUNTER — Telehealth: Payer: Self-pay | Admitting: *Deleted

## 2023-04-19 ENCOUNTER — Other Ambulatory Visit: Payer: Self-pay | Admitting: *Deleted

## 2023-04-19 ENCOUNTER — Encounter: Payer: Self-pay | Admitting: *Deleted

## 2023-04-19 MED ORDER — PEG 3350-KCL-NA BICARB-NACL 420 G PO SOLR: 4000.0000 mL | Freq: Once | ORAL | 0 refills | Status: AC

## 2023-04-19 NOTE — Telephone Encounter (Signed)
 Questionnaire from recall, no referral needed

## 2023-04-19 NOTE — Telephone Encounter (Signed)
 Pt has been scheduled for 05/18/23 with Dr.Rourk, instructions mailed and prep sent to the pharmacy

## 2023-04-19 NOTE — Telephone Encounter (Signed)
 error

## 2023-04-19 NOTE — Telephone Encounter (Signed)
 LMOVM to return call.

## 2023-05-18 ENCOUNTER — Ambulatory Visit (HOSPITAL_COMMUNITY): Admitting: Anesthesiology

## 2023-05-18 ENCOUNTER — Encounter (HOSPITAL_COMMUNITY)
Admission: RE | Disposition: A | Discharge status: Home / Self Care | Payer: Self-pay | Source: Home / Self Care | Attending: Internal Medicine

## 2023-05-18 ENCOUNTER — Encounter (HOSPITAL_COMMUNITY): Payer: Self-pay | Admitting: Internal Medicine

## 2023-05-18 ENCOUNTER — Ambulatory Visit (HOSPITAL_COMMUNITY)
Admission: RE | Admit: 2023-05-18 | Discharge: 2023-05-18 | Disposition: A | Discharge status: Home / Self Care | Payer: BC Managed Care – PPO | Attending: Internal Medicine | Admitting: Internal Medicine

## 2023-05-18 ENCOUNTER — Other Ambulatory Visit: Payer: Self-pay

## 2023-05-18 DIAGNOSIS — K5730 Diverticulosis of large intestine without perforation or abscess without bleeding: Secondary | ICD-10-CM | POA: Diagnosis not present

## 2023-05-18 DIAGNOSIS — Z860100 Personal history of colon polyps, unspecified: Secondary | ICD-10-CM | POA: Diagnosis not present

## 2023-05-18 DIAGNOSIS — Z860101 Personal history of adenomatous and serrated colon polyps: Secondary | ICD-10-CM | POA: Insufficient documentation

## 2023-05-18 DIAGNOSIS — I499 Cardiac arrhythmia, unspecified: Secondary | ICD-10-CM | POA: Diagnosis not present

## 2023-05-18 DIAGNOSIS — Z1211 Encounter for screening for malignant neoplasm of colon: Secondary | ICD-10-CM | POA: Insufficient documentation

## 2023-05-18 DIAGNOSIS — Z87891 Personal history of nicotine dependence: Secondary | ICD-10-CM | POA: Diagnosis not present

## 2023-05-18 HISTORY — PX: COLONOSCOPY WITH PROPOFOL: SHX5780

## 2023-05-18 SURGERY — COLONOSCOPY WITH PROPOFOL
Anesthesia: General

## 2023-05-18 MED ORDER — LIDOCAINE HCL (CARDIAC) PF 100 MG/5ML IV SOSY
PREFILLED_SYRINGE | INTRAVENOUS | Status: DC | PRN
  Administered 2023-05-18: 60 mg via INTRATRACHEAL

## 2023-05-18 MED ORDER — LACTATED RINGERS IV SOLN: INTRAVENOUS | Status: DC

## 2023-05-18 MED ORDER — LIDOCAINE HCL (PF) 2 % IJ SOLN
INTRAMUSCULAR | Status: AC
  Filled 2023-05-18: qty 5

## 2023-05-18 MED ORDER — PROPOFOL 10 MG/ML IV BOLUS
INTRAVENOUS | Status: DC | PRN
Start: 2023-05-18 — End: 2023-05-18
  Administered 2023-05-18: 40 mg via INTRAVENOUS
  Administered 2023-05-18: 70 mg via INTRAVENOUS

## 2023-05-18 MED ORDER — PHENYLEPHRINE 80 MCG/ML (10ML) SYRINGE FOR IV PUSH (FOR BLOOD PRESSURE SUPPORT)
PREFILLED_SYRINGE | INTRAVENOUS | Status: DC | PRN
  Administered 2023-05-18: 160 ug via INTRAVENOUS

## 2023-05-18 MED ORDER — PROPOFOL 500 MG/50ML IV EMUL
INTRAVENOUS | Status: DC | PRN
  Administered 2023-05-18: 150 ug/kg/min via INTRAVENOUS

## 2023-05-18 MED ORDER — PROPOFOL 500 MG/50ML IV EMUL
INTRAVENOUS | Status: AC
  Filled 2023-05-18: qty 50

## 2023-05-18 NOTE — Op Note (Signed)
 [facility] Patient Name: Alex Weaver Procedure Date: 05/18/2023 11:39 AM MRN: [id number] Date of Birth: 1960/05/05 Attending MD: Gennette Pac , MD, [id number] CSN: [id number] Age: 63 Admit Type: Outpatient Procedure:                Colonoscopy Indications:              High risk colon cancer surveillance: Personal                            history of colonic polyps Providers:                Gennette Pac, MD, Sheran Fava, Lennice Sites Technician, Technician Referring MD:              Medicines:                Propofol per Anesthesia Complications:            No immediate complications. Estimated Blood Loss:     Estimated blood loss: none. Procedure:                Pre-Anesthesia Assessment:                           - Prior to the procedure, a History and Physical                            was performed, and patient medications and                            allergies were reviewed. The patient's tolerance of                            previous anesthesia was also reviewed. The risks                            and benefits of the procedure and the sedation                            options and risks were discussed with the patient.                            All questions were answered, and informed consent                            was obtained. Prior Anticoagulants: The patient has                            taken no anticoagulant or antiplatelet agents. ASA                            Grade Assessment: II - A patient with mild systemic  disease. After reviewing the risks and benefits,                            the patient was deemed in satisfactory condition to                            undergo the procedure.                           After obtaining informed consent, the colonoscope                            was passed under direct vision. Throughout the                            procedure, the  patient's blood pressure, pulse, and                            oxygen saturations were monitored continuously. The                            [id number]) scope was introduced through the                            anus and advanced to the the cecum, identified by                            appendiceal orifice and ileocecal valve. The                            colonoscopy was performed without difficulty. The                            patient tolerated the procedure well. The quality                            of the bowel preparation was adequate. The                            ileocecal valve, appendiceal orifice, and rectum                            were photographed. The colonoscopy was performed                            without difficulty. The entire colon was well                            visualized. Scope In: 12:31:33 PM Scope Out: 12:41:57 PM Scope Withdrawal Time: 0 hours 6 minutes 18 seconds  Total Procedure Duration: 0 hours 10 minutes 24 seconds  Findings:      The perianal and digital rectal examinations were normal.      Scattered medium-mouthed diverticula were found in the sigmoid colon and  descending colon.      The exam was otherwise without abnormality on direct and retroflexion       views. Impression:               - Diverticulosis in the sigmoid colon and in the                            descending colon.                           - The examination was otherwise normal on direct                            and retroflexion views.                           - No specimens collected. Moderate Sedation:      Moderate (conscious) sedation was personally administered by an       anesthesia professional. The following parameters were monitored: oxygen       saturation, heart rate, blood pressure, and response to care. Recommendation:           - Patient has a contact number available for                            emergencies. The signs and symptoms  of potential                            delayed complications were discussed with the                            patient. Return to normal activities tomorrow.                            Written discharge instructions were provided to the                            patient.                           - Advance diet as tolerated.                           - Continue present medications.                           - Repeat colonoscopy in 7 years for surveillance.                           - Return to GI office (date not yet determined). Procedure Code(s):        --- Professional ---                           [phone], Colonoscopy, flexible; diagnostic, including                            collection of specimen(s)  by brushing or washing,                            when performed (separate procedure) Diagnosis Code(s):        --- Professional ---                           Z86.010, Personal history of colonic polyps                           K57.30, Diverticulosis of large intestine without                            perforation or abscess without bleeding CPT copyright 2022 American [employer] Association. All rights reserved. The codes documented in this report are preliminary and upon coder review may  be revised to meet current compliance requirements. [name]. [name], MD Gennette Pac, MD 05/18/2023 12:48:12 PM This report has been signed electronically. Number of Addenda: 0

## 2023-05-18 NOTE — Anesthesia Preprocedure Evaluation (Signed)
 Anesthesia Evaluation  Patient identified by MRN, date of birth, ID band Patient awake    Reviewed: Allergy & Precautions, H&P , NPO status , Patient's Chart, lab work & pertinent test results, reviewed documented beta blocker date and time   Airway Mallampati: II  TM Distance: >3 FB Neck ROM: full    Dental no notable dental hx.    Pulmonary neg pulmonary ROS, former smoker   Pulmonary exam normal breath sounds clear to auscultation       Cardiovascular Exercise Tolerance: Good hypertension, negative cardio ROS + dysrhythmias  Rhythm:regular Rate:Normal     Neuro/Psych  PSYCHIATRIC DISORDERS  Depression    negative neurological ROS  negative psych ROS   GI/Hepatic negative GI ROS, Neg liver ROS,,,  Endo/Other  negative endocrine ROS    Renal/GU negative Renal ROS  negative genitourinary   Musculoskeletal   Abdominal   Peds  Hematology negative hematology ROS (+)   Anesthesia Other Findings   Reproductive/Obstetrics negative OB ROS                             Anesthesia Physical Anesthesia Plan  ASA: 2  Anesthesia Plan: General   Post-op Pain Management:    Induction:   PONV Risk Score and Plan: Propofol infusion  Airway Management Planned:   Additional Equipment:   Intra-op Plan:   Post-operative Plan:   Informed Consent: I have reviewed the patients History and Physical, chart, labs and discussed the procedure including the risks, benefits and alternatives for the proposed anesthesia with the patient or authorized representative who has indicated his/her understanding and acceptance.     Dental Advisory Given  Plan Discussed with: CRNA  Anesthesia Plan Comments:        Anesthesia Quick Evaluation

## 2023-05-18 NOTE — Transfer of Care (Addendum)
 Immediate Anesthesia Transfer of Care Note  Patient: Alex Weaver  Procedure(s) Performed: COLONOSCOPY WITH PROPOFOL  Patient Location: Endoscopy Unit  Anesthesia Type:General  Level of Consciousness: drowsy and patient cooperative  Airway & Oxygen Therapy: Patient Spontanous Breathing and Patient connected to nasal cannula oxygen  Post-op Assessment: Report given to RN and Post -op Vital signs reviewed and stable  Post vital signs: Reviewed and stable  Last Vitals:  Vitals Value Taken Time  BP 103/56 05/18/23   1245  Temp 36.5 05/18/23   1245  Pulse 67 05/18/23   1245  Resp 16 05/18/23   1245  SpO2 96% 05/18/23   1245    Last Pain:  Vitals:   05/18/23 1226  TempSrc:   PainSc: 0-No pain      Patients Stated Pain Goal: 6 (05/18/23 1106)  Complications: No notable events documented.

## 2023-05-18 NOTE — Discharge Instructions (Signed)
  Colonoscopy Discharge Instructions  Read the instructions outlined below and refer to this sheet in the next few weeks. These discharge instructions provide you with general information on caring for yourself after you leave the hospital. Your doctor may also give you specific instructions. While your treatment has been planned according to the most current medical practices available, unavoidable complications occasionally occur. If you have any problems or questions after discharge, call Dr. Jena Gauss at [phone]. ACTIVITY You may resume your regular activity, but move at a slower pace for the next 24 hours.  Take frequent rest periods for the next 24 hours.  Walking will help get rid of the air and reduce the bloated feeling in your belly (abdomen).  No driving for 24 hours (because of the medicine (anesthesia) used during the test).   Do not sign any important legal documents or operate any machinery for 24 hours (because of the anesthesia used during the test).  NUTRITION Drink plenty of fluids.  You may resume your normal diet as instructed by your doctor.  Begin with a light meal and progress to your normal diet. Heavy or fried foods are harder to digest and may make you feel sick to your stomach (nauseated).  Avoid alcoholic beverages for 24 hours or as instructed.  MEDICATIONS You may resume your normal medications unless your doctor tells you otherwise.  WHAT YOU CAN EXPECT TODAY Some feelings of bloating in the abdomen.  Passage of more gas than usual.  Spotting of blood in your stool or on the toilet paper.  IF YOU HAD POLYPS REMOVED DURING THE COLONOSCOPY: No aspirin products for 7 days or as instructed.  No alcohol for 7 days or as instructed.  Eat a soft diet for the next 24 hours.  FINDING OUT THE RESULTS OF YOUR TEST Not all test results are available during your visit. If your test results are not back during the visit, make an appointment with your caregiver to find out the  results. Do not assume everything is normal if you have not heard from your caregiver or the medical facility. It is important for you to follow up on all of your test results.  SEEK IMMEDIATE MEDICAL ATTENTION IF: You have more than a spotting of blood in your stool.  Your belly is swollen (abdominal distention).  You are nauseated or vomiting.  You have a temperature over 101.  You have abdominal pain or discomfort that is severe or gets worse throughout the day.    No polyps found today.  Diverticulosis information provided  It is recommended you return for repeat colonoscopy in 7 years  At patient request I called Bion Todorov at [phone] a message as call rolled to voicemail.

## 2023-05-18 NOTE — H&P (Signed)
 @LOGO @   Primary Care Physician:  Assunta Found, MD Primary Gastroenterologist:  Dr. Jena Gauss  Pre-Procedure History & Physical: HPI:  Alex Weaver is a 63 y.o. male here for  surveillance colonoscopy history of multiple colonic adenomas removed over time.  Past Medical History:  Diagnosis Date   Bradycardia    Depression    Dysrhythmia    echocardiogram 04/17/2018   Insomnia    Memory loss    Polyp of colon     Past Surgical History:  Procedure Laterality Date   bilateral knee surgery Bilateral 01/16/2019   bilateral acl repair   COLONOSCOPY N/A 01/17/2013   Procedure: COLONOSCOPY;  Surgeon: Corbin Ade, MD;  Location: [facility];  Service: Endoscopy;  Laterality: N/A;  8:15 AM   COLONOSCOPY N/A 04/19/2018   Procedure: COLONOSCOPY;  Surgeon: Corbin Ade, MD;  Location: [facility];  Service: Endoscopy;  Laterality: N/A;  8:30   HERNIA REPAIR Left    inguinal   POLYPECTOMY  04/19/2018   Procedure: POLYPECTOMY;  Surgeon: Corbin Ade, MD;  Location: [facility];  Service: Endoscopy;;  colon    Prior to Admission medications   Medication Sig Start Date End Date Taking? Authorizing Provider  Multiple Vitamin (MULTIVITAMIN WITH MINERALS) TABS tablet Take 1 tablet by mouth daily.   Yes [provider]  Omega-3 Fatty Acids (FISH OIL PO) Take by mouth. 2400mg  daily   Yes [provider]  azithromycin (ZITHROMAX) 250 MG tablet Take 1 tablet (250 mg total) by mouth daily. Take first 2 tablets together, then 1 every day until finished. 04/14/23   Leath-Warren, Sadie Haber, NP  OVER THE COUNTER MEDICATION Epimedium extract 1500mg  daily Resveratrol 1500mg  daily Melatonin every 3rd day, benadryl every 3rd day    [provider]  promethazine-dextromethorphan (PROMETHAZINE-DM) 6.25-15 MG/5ML syrup Take 5 mLs by mouth 4 (four) times daily as needed. 04/14/23   Leath-Warren, Sadie Haber, NP  pyridOXINE (VITAMIN B6) 100 MG tablet Take 100 mg by mouth daily.     [provider]  temazepam (RESTORIL) 30 MG capsule Take 30 mg by mouth every other day. At bedtime.    [provider]  Turmeric 500 MG TABS Take by mouth daily.    [provider]    Allergies as of 04/19/2023   (No Known Allergies)    Family History  Problem Relation Age of Onset   Dementia Mother    Lung cancer Father    Diabetes Father    Migraines Sister     Social History   Socioeconomic History   Marital status: Married    Spouse name: Not on file   Number of children: 1   Years of education: 16   Highest education level: Bachelor's degree (e.g., BA, AB, BS)  Occupational History   Occupation: quality lab superviser  Tobacco Use   Smoking status: Former    Types: Cigarettes   Smokeless tobacco: Never  Vaping Use   Vaping status: Former  Substance and Sexual Activity   Alcohol use: Yes    Alcohol/week: 12.0 standard drinks of alcohol    Types: 12 Cans of beer per week    Comment: on the weekends   Drug use: Never   Sexual activity: Not on file  Other Topics Concern   Not on file  Social History Narrative   Caffeine use: 2 cups per day.   Right-handed.   Lives at home with his wife.   Social Drivers of Health  Financial Resource Strain: Not on file  Food Insecurity: Not on file  Transportation Needs: Not on file  Physical Activity: Not on file  Stress: Not on file  Social Connections: Not on file  Intimate Partner Violence: Not on file    Review of Systems: See HPI, otherwise negative ROS  Physical Exam: BP 117/81   Pulse 72   Temp 97.9 F (36.6 C) (Oral)   Resp 17   Ht 5\' 10"  (1.778 m)   Wt 82.6 kg   SpO2 96%   BMI 26.11 kg/m  General:   Alert,  Well-developed, well-nourished, pleasant and cooperative in NAD Lungs:  Clear throughout to auscultation.   No wheezes, crackles, or rhonchi. No acute distress. Heart:  Regular rate and rhythm; no murmurs, clicks, rubs,  or gallops. Abdomen: Non-distended, normal  bowel sounds.  Soft and nontender without appreciable mass or hepatosplenomegaly.   Impression/Plan:    63 year old gentleman here for surveillance colonoscopy.  History of colonic adenomas removed previously. The risks, benefits, limitations, alternatives and imponderables have been reviewed with the patient. Questions have been answered. All parties are agreeable.       Notice: This dictation was prepared with Dragon dictation along with smaller phrase technology. Any transcriptional errors that result from this process are unintentional and may not be corrected upon review.

## 2023-05-21 ENCOUNTER — Encounter (HOSPITAL_COMMUNITY): Payer: Self-pay | Admitting: Internal Medicine

## 2023-05-21 NOTE — Anesthesia Postprocedure Evaluation (Signed)
 Anesthesia Post Note  Patient: Alex Weaver  Procedure(s) Performed: COLONOSCOPY WITH PROPOFOL  Patient location during evaluation: Phase II Anesthesia Type: General Level of consciousness: awake Pain management: pain level controlled Vital Signs Assessment: post-procedure vital signs reviewed and stable Respiratory status: spontaneous breathing and respiratory function stable Cardiovascular status: blood pressure returned to baseline and stable Postop Assessment: no headache and no apparent nausea or vomiting Anesthetic complications: no Comments: Late entry   No notable events documented.   Last Vitals:  Vitals:   05/18/23 1106 05/18/23 1245  BP: 117/81 (!) 103/56  Pulse: 72 67  Resp: 17 16  Temp: 36.6 C 36.5 C  SpO2: 96% 96%    Last Pain:  Vitals:   05/18/23 1245  TempSrc: Oral  PainSc: 0-No pain                 Windell Norfolk

## 2023-08-16 DIAGNOSIS — S0501XA Injury of conjunctiva and corneal abrasion without foreign body, right eye, initial encounter: Secondary | ICD-10-CM | POA: Diagnosis not present

## 2023-08-27 DIAGNOSIS — M25571 Pain in right ankle and joints of right foot: Secondary | ICD-10-CM | POA: Diagnosis not present

## 2023-09-03 DIAGNOSIS — M79671 Pain in right foot: Secondary | ICD-10-CM | POA: Diagnosis not present

## 2023-09-06 DIAGNOSIS — Z0000 Encounter for general adult medical examination without abnormal findings: Secondary | ICD-10-CM | POA: Diagnosis not present

## 2023-09-06 DIAGNOSIS — Z6827 Body mass index (BMI) 27.0-27.9, adult: Secondary | ICD-10-CM | POA: Diagnosis not present

## 2023-09-06 DIAGNOSIS — Z1331 Encounter for screening for depression: Secondary | ICD-10-CM | POA: Diagnosis not present

## 2023-09-06 DIAGNOSIS — E7849 Other hyperlipidemia: Secondary | ICD-10-CM | POA: Diagnosis not present

## 2023-09-06 DIAGNOSIS — E663 Overweight: Secondary | ICD-10-CM | POA: Diagnosis not present

## 2023-09-06 DIAGNOSIS — G4700 Insomnia, unspecified: Secondary | ICD-10-CM | POA: Diagnosis not present

## 2023-09-06 DIAGNOSIS — E782 Mixed hyperlipidemia: Secondary | ICD-10-CM | POA: Diagnosis not present

## 2023-09-07 ENCOUNTER — Other Ambulatory Visit (HOSPITAL_BASED_OUTPATIENT_CLINIC_OR_DEPARTMENT_OTHER): Payer: Self-pay | Admitting: Family Medicine

## 2023-09-07 DIAGNOSIS — Z0000 Encounter for general adult medical examination without abnormal findings: Secondary | ICD-10-CM

## 2023-09-07 DIAGNOSIS — E7849 Other hyperlipidemia: Secondary | ICD-10-CM

## 2023-09-12 DIAGNOSIS — D225 Melanocytic nevi of trunk: Secondary | ICD-10-CM | POA: Diagnosis not present

## 2023-09-12 DIAGNOSIS — C44311 Basal cell carcinoma of skin of nose: Secondary | ICD-10-CM | POA: Diagnosis not present

## 2023-09-12 DIAGNOSIS — L812 Freckles: Secondary | ICD-10-CM | POA: Diagnosis not present

## 2023-09-12 DIAGNOSIS — L821 Other seborrheic keratosis: Secondary | ICD-10-CM | POA: Diagnosis not present

## 2023-09-17 DIAGNOSIS — M79671 Pain in right foot: Secondary | ICD-10-CM | POA: Diagnosis not present

## 2023-10-05 ENCOUNTER — Ambulatory Visit (HOSPITAL_COMMUNITY)
Admission: RE | Admit: 2023-10-05 | Discharge: 2023-10-05 | Disposition: A | Discharge status: Home / Self Care | Payer: Self-pay | Source: Ambulatory Visit | Attending: Family Medicine | Admitting: Family Medicine

## 2023-10-05 DIAGNOSIS — Z0000 Encounter for general adult medical examination without abnormal findings: Secondary | ICD-10-CM | POA: Insufficient documentation

## 2023-10-05 DIAGNOSIS — E7849 Other hyperlipidemia: Secondary | ICD-10-CM | POA: Insufficient documentation

## 2023-10-18 DIAGNOSIS — C44311 Basal cell carcinoma of skin of nose: Secondary | ICD-10-CM | POA: Diagnosis not present

## 2024-01-21 DIAGNOSIS — M1732 Unilateral post-traumatic osteoarthritis, left knee: Secondary | ICD-10-CM | POA: Diagnosis not present

## 2024-01-21 DIAGNOSIS — M85471 Solitary bone cyst, right ankle and foot: Secondary | ICD-10-CM | POA: Diagnosis not present
